# Patient Record
Sex: Female | Born: 1945 | Race: White | Hispanic: No | Marital: Married
Health system: Midwestern US, Academic
[De-identification: ages and names within clinical notes are randomized; demographics above are authoritative.]

## PROBLEM LIST (undated history)

## (undated) DIAGNOSIS — G20 Parkinson's disease: Secondary | ICD-10-CM

## (undated) DIAGNOSIS — Z9109 Other allergy status, other than to drugs and biological substances: Secondary | ICD-10-CM

## (undated) DIAGNOSIS — K219 Gastro-esophageal reflux disease without esophagitis: Secondary | ICD-10-CM

## (undated) DIAGNOSIS — F419 Anxiety disorder, unspecified: Secondary | ICD-10-CM

## (undated) DIAGNOSIS — G20A1 Parkinson's disease without dyskinesia, without mention of fluctuations: Secondary | ICD-10-CM

## (undated) HISTORY — DX: Parkinson's disease without dyskinesia, without mention of fluctuations: G20.A1

## (undated) HISTORY — DX: Gastro-esophageal reflux disease without esophagitis: K21.9

## (undated) HISTORY — DX: Other allergy status, other than to drugs and biological substances: Z91.09

## (undated) HISTORY — DX: Anxiety disorder, unspecified: F41.9

## (undated) HISTORY — PX: TUBAL LIGATION: SHX77

## (undated) HISTORY — DX: Parkinson's disease: G20

---

## 2014-12-11 ENCOUNTER — Telehealth: Payer: Self-pay

## 2014-12-11 ENCOUNTER — Ambulatory Visit (INDEPENDENT_AMBULATORY_CARE_PROVIDER_SITE_OTHER): Payer: Medicare Other | Admitting: Emergency Medicine

## 2014-12-11 ENCOUNTER — Ambulatory Visit (INDEPENDENT_AMBULATORY_CARE_PROVIDER_SITE_OTHER): Payer: Medicare Other

## 2014-12-11 VITALS — BP 122/80 | HR 66 | Temp 98.4°F | Ht 66.5 in | Wt 134.6 lb

## 2014-12-11 DIAGNOSIS — M79641 Pain in right hand: Secondary | ICD-10-CM

## 2014-12-11 DIAGNOSIS — S62609A Fracture of unspecified phalanx of unspecified finger, initial encounter for closed fracture: Secondary | ICD-10-CM

## 2014-12-11 DIAGNOSIS — J209 Acute bronchitis, unspecified: Secondary | ICD-10-CM | POA: Diagnosis not present

## 2014-12-11 MED ORDER — CLARITHROMYCIN 500 MG PO TABS: 500.0000 mg | ORAL_TABLET | Freq: Two times a day (BID) | ORAL | Status: AC

## 2014-12-11 MED ORDER — HYDROCODONE-ACETAMINOPHEN 5-325 MG PO TABS: 1.0000 | ORAL_TABLET | ORAL | Status: AC | PRN

## 2014-12-11 NOTE — Patient Instructions (Signed)
Hand Fracture, Metacarpals °Fractures of metacarpals are breaks in the bones of the hand. They extend from the knuckles to the wrist. These bones can undergo many types of fractures. There are different ways of treating these fractures, all of which may be correct. °TREATMENT  °Hand fractures can be treated with:  °· Non-reduction - The fracture is casted without changing the positions of the fracture (bone pieces) involved. This fracture is usually left in a cast for 4 to 6 weeks or as your caregiver thinks necessary. °· Closed reduction - The bones are moved back into position without surgery and then casted. °· ORIF (open reduction and internal fixation) - The fracture site is opened and the bone pieces are fixed into place with some type of hardware, such as screws, etc. They are then casted. °Your caregiver will discuss the type of fracture you have and the treatment that should be best for that problem. If surgery is chosen, let your caregivers know about the following.  °LET YOUR CAREGIVERS KNOW ABOUT: °· Allergies. °· Medications you are taking, including herbs, eye drops, over the counter medications, and creams. °· Use of steroids (by mouth or creams). °· Previous problems with anesthetics or novocaine. °· Possibility of pregnancy. °· History of blood clots (thrombophlebitis). °· History of bleeding or blood problems. °· Previous surgeries. °· Other health problems. °AFTER THE PROCEDURE °After surgery, you will be taken to the recovery area where a nurse will watch and check your progress. Once you are awake, stable, and taking fluids well, barring other problems, you'll be allowed to go home. Once home, an ice pack applied to your operative site may help with pain and keep the swelling down. °HOME CARE INSTRUCTIONS  °· Follow your caregiver's instructions as to activities, exercises, physical therapy, and driving a car. °· Daily exercise is helpful for keeping range of motion and strength. Exercise as  instructed. °· To lessen swelling, keep the injured hand elevated above the level of your heart as much as possible. °· Apply ice to the injury for 15-20 minutes each hour while awake for the first 2 days. Put the ice in a plastic bag and place a thin towel between the bag of ice and your cast. °· Move the fingers of your casted hand several times a day. °· If a plaster or fiberglass cast was applied: °¨ Do not try to scratch the skin under the cast using a sharp or pointed object. °¨ Check the skin around the cast every day. You may put lotion on red or sore areas. °¨ Keep your cast dry. Your cast can be protected during bathing with a plastic bag. Do not put your cast into the water. °· If a plaster splint was applied: °¨ Wear your splint for as long as directed by your caregiver or until seen again. °¨ Do not get your splint wet. Protect it during bathing with a plastic bag. °¨ You may loosen the elastic bandage around the splint if your fingers start to get numb, tingle, get cold or turn blue. °· Do not put pressure on your cast or splint; this may cause it to break. Especially, do not lean plaster casts on hard surfaces for 24 hours after application. °· Take medications as directed by your caregiver. °· Only take over-the-counter or prescription medicines for pain, discomfort, or fever as directed by your caregiver. °· Follow-up as provided by your caregiver. This is very important in order to avoid permanent injury or disability and chronic   pain. °SEEK MEDICAL CARE IF:  °· Increased bleeding (more than a small spot) from beneath your cast or splint if there is beneath the cast as with an open reduction. °· Redness, swelling, or increasing pain in the wound or from beneath your cast or splint. °· Pus coming from wound or from beneath your cast or splint. °· An unexplained oral temperature above 102° F (38.9° C) develops, or as your caregiver suggests. °· A foul smell coming from the wound or dressing or from  beneath your cast or splint. °· You have a problem moving any of your fingers. °SEEK IMMEDIATE MEDICAL CARE IF:  °· You develop a rash °· You have difficulty breathing °· You have any allergy problems °If you do not have a window in your cast for observing the wound, a discharge or minor bleeding may show up as a stain on the outside of your cast. Report these findings to your caregiver. °MAKE SURE YOU:  °· Understand these instructions. °· Will watch your condition. °· Will get help right away if you are not doing well or get worse. °Document Released: 09/10/2005 Document Revised: 12/03/2011 Document Reviewed: 04/29/2008 °ExitCare® Patient Information ©2015 [employer]. This information is not intended to replace advice given to you by your health care provider. Make sure you discuss any questions you have with your health care provider. ° °

## 2014-12-11 NOTE — Progress Notes (Signed)
[facility] [address], [place] [id number] [phone]  Date:  12/11/2014   Name:  Mackenzie PortLinda Fitzgerald   DOB:  [birth year]   MRN:  [id number]  PCP:  No PCP Per Patient    Chief Complaint: Cough and Hand Pain   History of Present Illness:  Mackenzie Fitzgerald is a 69 y.o. very pleasant female patient who presents with the following:  Ill for a week with a cough productive scant sputum Worse at night No wheezing or shortness of breath Nasal congestion and post nasal drip. Sore throat and pain in ears. Had a fever now resolved. FOOSH right hand on Monday.  Still painful and swollen No improvement with over the counter medications or other home remedies.  Denies other complaint or health concern today.   There are no active problems to display for this patient.   Past Medical History  Diagnosis Date  . Anxiety   . Environmental allergies   . GERD (gastroesophageal reflux disease)   . Parkinson disease     Past Surgical History  Procedure Laterality Date  . Tubal ligation      History  Substance Use Topics  . Smoking status: Never Smoker   . Smokeless tobacco: Not on file  . Alcohol Use: Not on file    Family History  Problem Relation Age of Onset  . Diabetes Mother   . Heart failure Father   . Cancer Brother   . Cancer Maternal Grandmother   . Cancer Maternal Grandfather   . Diabetes Paternal Grandmother     Allergies  Allergen Reactions  . Latex     Medication list has been reviewed and updated.  No current outpatient prescriptions on file prior to visit.   No current facility-administered medications on file prior to visit.    Review of Systems:  As per HPI, otherwise negative.    Physical Examination: Filed Vitals:   12/11/14 1034  BP: 122/80  Pulse: 66  Temp: 98.4 F (36.9 C)   Filed Vitals:   12/11/14 1034  Height: 5' 6.5" (1.689 m)  Weight: 134 lb 9.6 oz (61.054 kg)   Body mass index is 21.4 kg/(m^2). Ideal  Body Weight: Weight in (lb) to have BMI = 25: 156.9  GEN: WDWN, NAD, Non-toxic, A & O x 3 HEENT: Atraumatic, Normocephalic. Neck supple. No masses, No LAD. Ears and Nose: No external deformity. CV: RRR, No M/G/R. No JVD. No thrill. No extra heart sounds. PULM: CTA B, no wheezes, crackles, rhonchi. No retractions. No resp. distress. No accessory muscle use. ABD: S, NT, ND, +BS. No rebound. No HSM. EXTR: No c/c/e  Right hand swollen, tender and ecchymotic NEURO Normal gait.  PSYCH: Normally interactive. Conversant. Not depressed or anxious appearing.  Calm demeanor.    Assessment and Plan: Fracture second metacarpal Bronchitis Radial gutter Ortho biaxin tussionex   Signed,  Phillips OdorJeffery Anderson, MD   [facility] reading (PRIMARY) by  Dr. Dareen PianoAnderson.  Fracture head second MT.

## 2014-12-11 NOTE — Telephone Encounter (Signed)
Patient called in stating she had give the wrong cell number for someone to call, her number is [phone].

## 2015-10-11 IMAGING — CR DG HAND COMPLETE 3+V*R*
2 series · 2 of 2 positions shown · non-contrast
Comparison: None.

CLINICAL DATA: Initial evaluation for hand pain in the region of
the first metacarpal area with swelling and bruising, fell 5 days
ago

EXAM:
RIGHT HAND - COMPLETE 3+ VIEW

[PA]
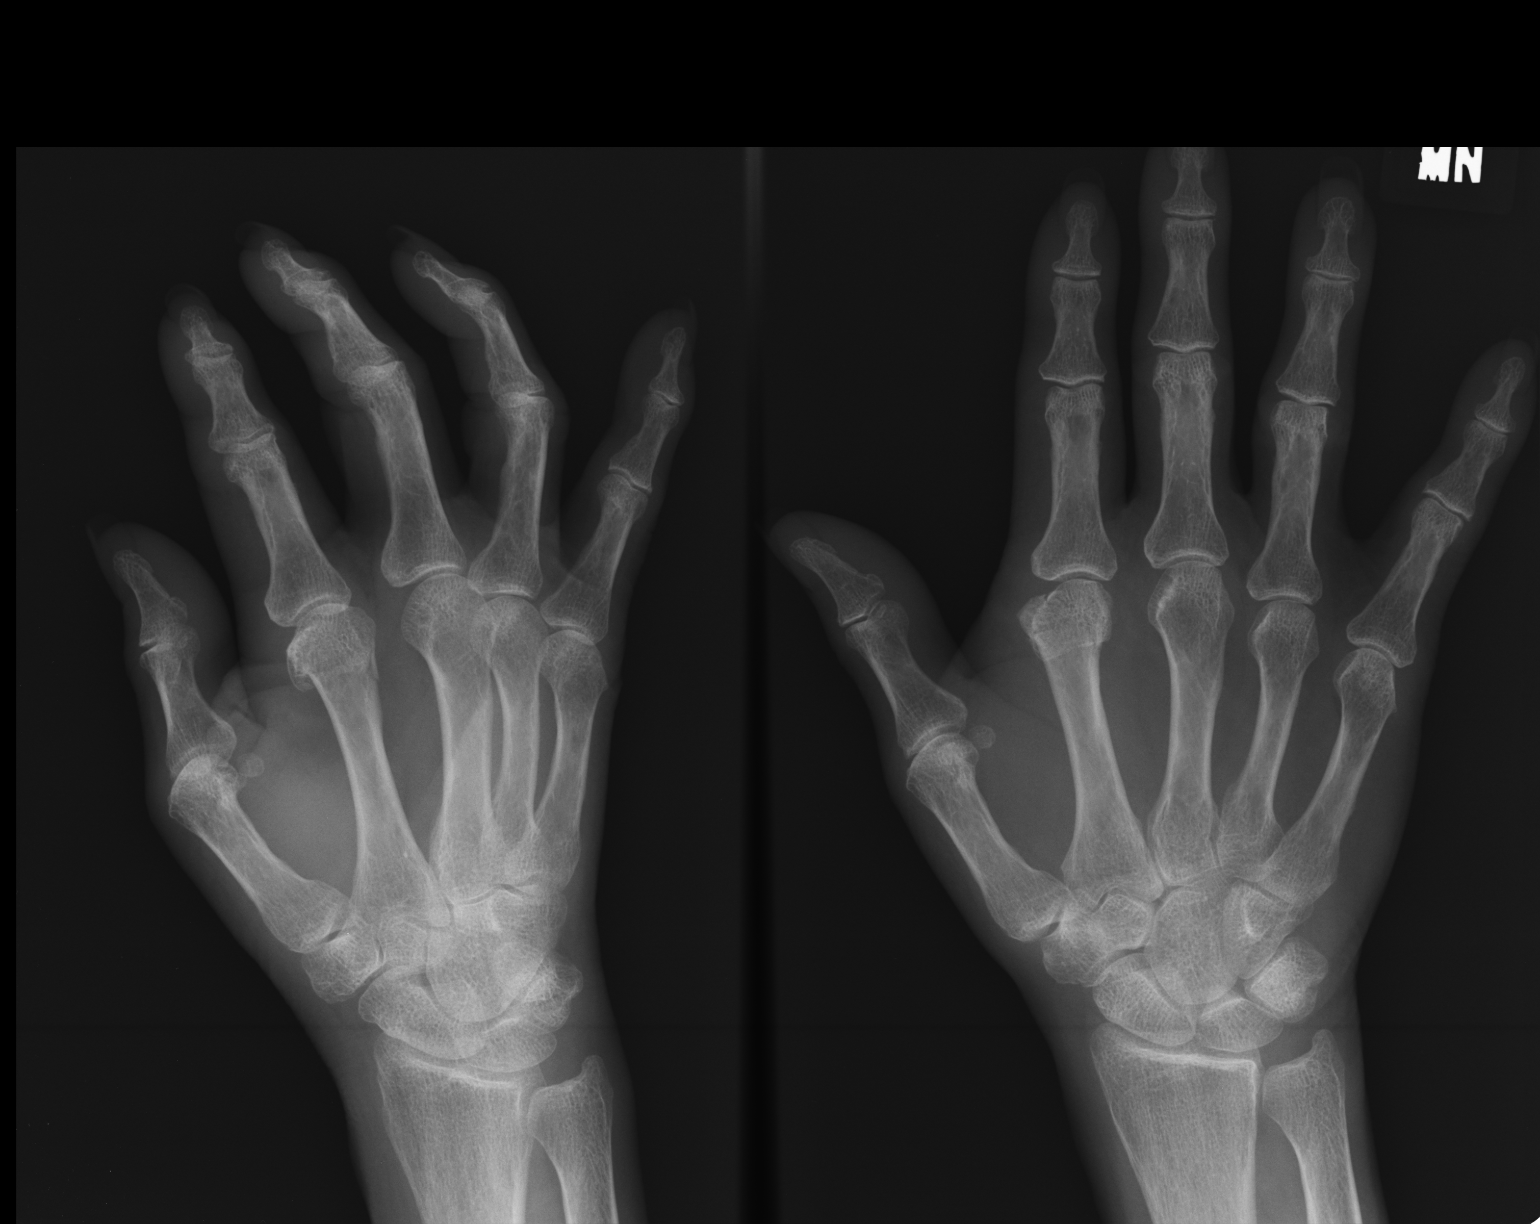

[lateral]
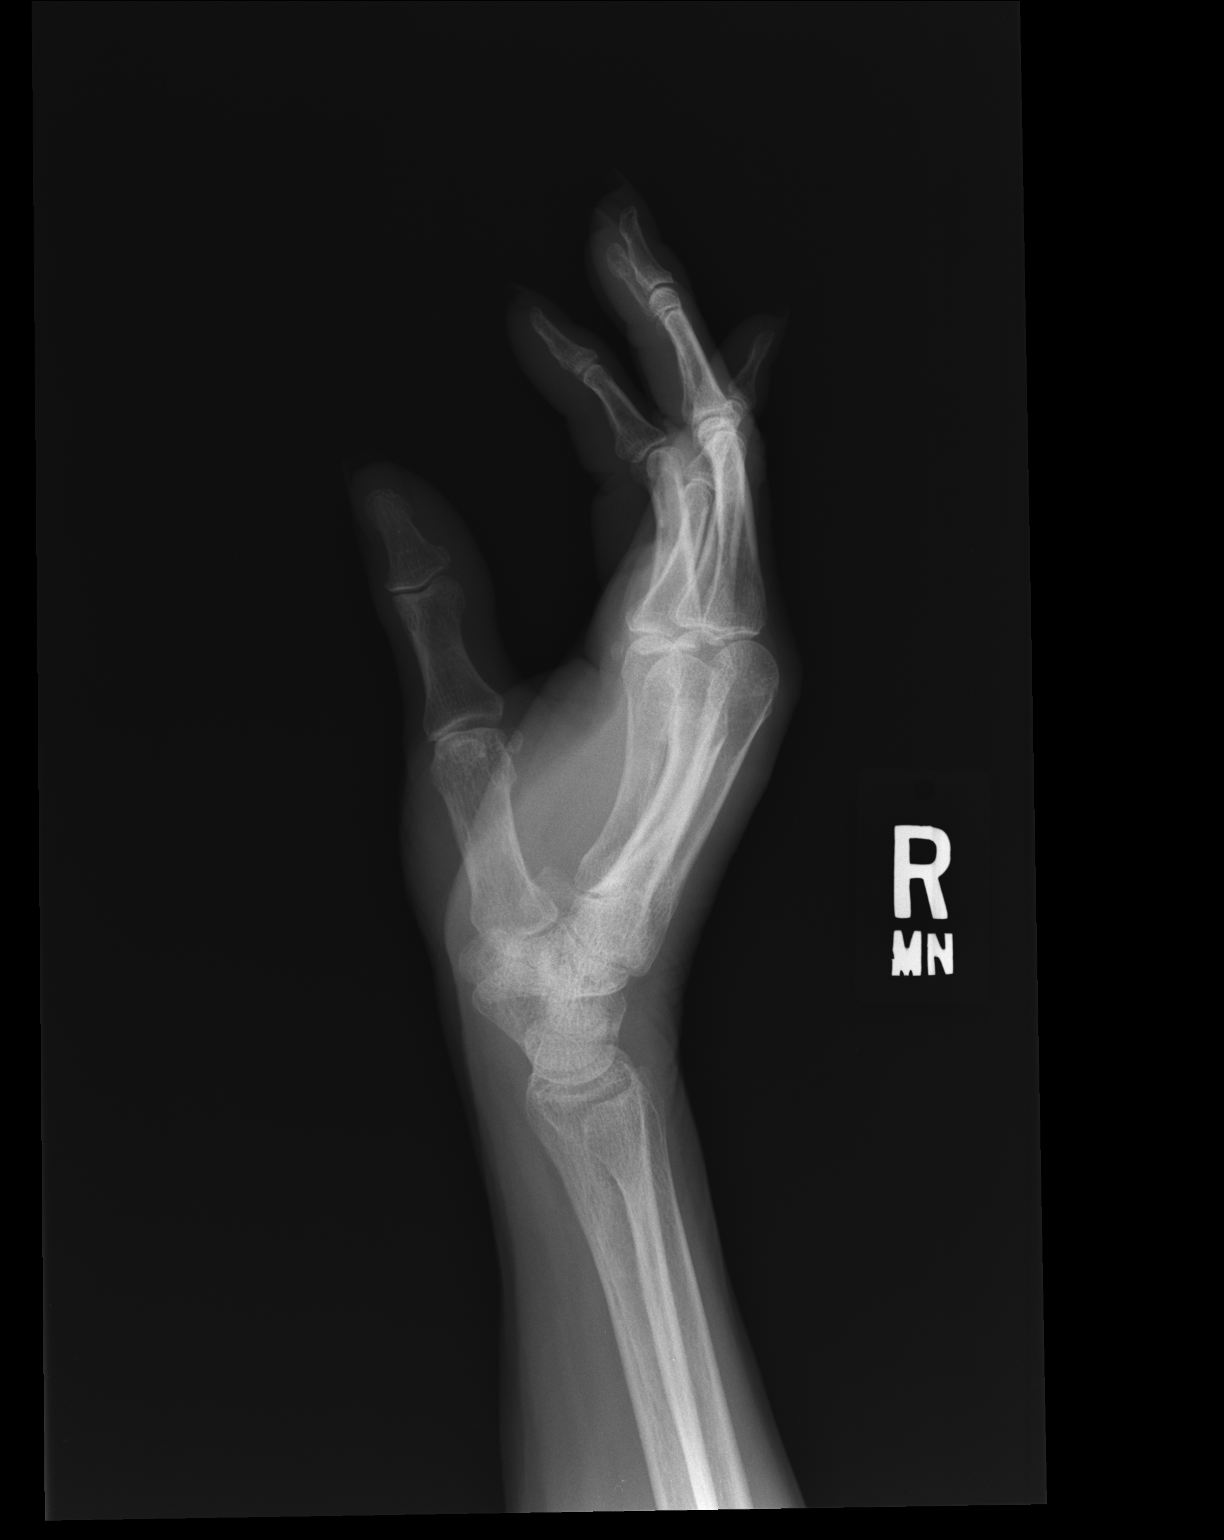

[2 of 2 positions shown; findings below may reference images not displayed]

FINDINGS: There is a fracture of the head of the second metacarpal. Fracture
line extends into the metacarpophalangeal joint and is mildly
displaced. There is mild apex dorsal angulation.
IMPRESSION: Fracture of the distal aspect of the second metacarpal.

## 2017-07-29 ENCOUNTER — Encounter: Admit: 2017-07-29 | Discharge: 2017-07-29 | Payer: MEDICARE

## 2017-09-10 ENCOUNTER — Encounter: Admit: 2017-09-10 | Discharge: 2017-09-10 | Payer: MEDICARE

## 2017-09-10 ENCOUNTER — Ambulatory Visit: Admit: 2017-09-10 | Discharge: 2017-09-10 | Payer: MEDICARE

## 2017-09-10 DIAGNOSIS — F3289 Other specified depressive episodes: ICD-10-CM

## 2017-09-10 DIAGNOSIS — M4800 Spinal stenosis, site unspecified: ICD-10-CM

## 2017-09-10 DIAGNOSIS — T50905A Adverse effect of unspecified drugs, medicaments and biological substances, initial encounter: ICD-10-CM

## 2017-09-10 DIAGNOSIS — G20 Parkinson's disease: Principal | ICD-10-CM

## 2017-09-10 DIAGNOSIS — R2689 Other abnormalities of gait and mobility: ICD-10-CM

## 2017-09-10 DIAGNOSIS — S72009A Fracture of unspecified part of neck of unspecified femur, initial encounter for closed fracture: ICD-10-CM

## 2017-09-10 DIAGNOSIS — R002 Palpitations: ICD-10-CM

## 2017-09-10 DIAGNOSIS — I10 Essential (primary) hypertension: ICD-10-CM

## 2017-09-10 DIAGNOSIS — L84 Corns and callosities: ICD-10-CM

## 2017-09-10 MED ORDER — PRAMIPEXOLE 0.25 MG PO TAB
ORAL_TABLET | Freq: Three times a day (TID) | ORAL | 3 refills | 45.00000 days | Status: AC
Start: 2017-09-10 — End: 2018-01-13

## 2017-09-10 NOTE — Progress Notes
Alexis Matthews is a 71 y.o. female.    Subjective:             History of Present Illness     Parkinson's Disease Follow-Up Visit    Since my last visit I am: Moderately worse    Symptoms Scale:    Memory problems: Slight, not bothersome  Hallucinations/delusions: Mild, illusions  Depression: None  Anxiety: Slight, not bothersome  Apathy: Slight, some loss of interest  Impulsive behavior: None  Nighttime sleep: No difficulty  Daytime sleepiness: Slight, occasionally  Vivid dreams: Slight, occasional  REM sleep behavior disorder: Slight, rarely  Restless leg syndrome: None  Pain or muscle cramps: None  Urination: Slight, go frequently, not bothersome  Constipation: Slight, occasionally  Dizziness or lightheadedness: Slight, occasional on standing  Tiredness/Fatigue: Slight, reduced stamina  Falling: Moderate, multiple times a month  Personal care assistance: Slight, I take care of myself, but several activities take me longer to complete    Total Score:  Total Score: 16    Assistive Devices:  Assistive devices for getting around: Walker    OFF Time:    OFF time: No    Dyskinesia:    Dyskinesia while awake: No    Employment Status:    Employment: Retired - not due to PD          Review of Systems   Gastrointestinal: Positive for constipation. Negative for abdominal distention, abdominal pain, anal bleeding, blood in stool, diarrhea, nausea, rectal pain and vomiting.   Genitourinary: Positive for enuresis.   Neurological: Positive for speech difficulty and light-headedness.         Objective:         ??? amantadine (SYMMETREL) 100 mg capsule 1 Cap daily.   ??? esomeprazole DR(+) (NEXIUM) 20 mg capsule Take 20 mg by mouth twice daily. Take on an empty stomach at least 1 hour before or 2 hours after food.   ??? gabapentin (NEURONTIN) 100 mg capsule Take 100 mg by mouth every 8 hours. Take 1.0 tablet daily.   ??? levothyroxine (SYNTHROID) 100 mcg tablet Take 100 mcg by mouth daily. ??? LORazepam (ATIVAN) 0.5 mg tablet Take 0.25 mg by mouth every 8 hours as needed. Tale 0.5 tablet once a day at 9pm.   ??? ondansetron (ZOFRAN) 4 mg tablet Take 4 mg by mouth every 8 hours as needed for Nausea.   ??? oxybutynin XL (DITROPAN XL) 10 mg tablet Take 10 mg by mouth daily.   ??? PARoxetine (PAXIL) 20 mg tablet Take 1 Tab by mouth daily.   ??? pramipexole (MIRAPEX) 0.25 mg tablet Start 1/2 tab three times a day for one week, one tablet three times a day for one week and two tablets three times a day thereafter.   ??? ranitidine(+) (ZANTAC) 300 mg tablet Take 1 Tab by mouth at bedtime daily.     Vitals:    09/10/17 1353 09/10/17 1358   BP: 135/76 107/63   Pulse: 69 83   Weight: 67 kg (147 lb 11.3 oz)    Height: 167.6 cm (66)      Body mass index is 23.84 kg/m???.     Physical Exam    EXAMINATION:     ORIENTATION: Alert and Oriented.  Visuospatial/Executive Points: 3/5  Naming Points: 3/3  Attention Points: 5/6  Language Points: 3/3  Abstraction Points: 2/2  Delayed Recall Points: 3/5  Orientation Points: 6/6  Education <or= 12 Years: 0/1  MOCA Score (out of 30): 25  Cranial Nerves: II-XII Unremarkable except reduced facial expression and soft voice.        Right Left   Bradkinesia  Mild Mild   Tremor Hands Resting None None    Postural None None    Kinetic None None           Tremor Other: None  Dyskinesia: None  Muscle Strength: Unremarkable  Gait: Needs assistance to walk    Unified Parkinson's Disease Rating Scale: OFF  Total Mentation Score: 4  Total Activities of Daily Living Score: 15  Total Motor Exam: 29  Total UPDRS Score: 48    Hoehn & Yahr Staging: 4    Schwab & [place] Staging: 75 %    Geriatric Depression Scale: 16 suggesting moderate depression.    Epworth Sleepiness Scale: 6 suggesting daytime drowsiness.    PDQ-39 suggests that his symptoms impact his quality of life by:  PDQ 39 IMPACT  Mobility Percent: 55 %  ADL Percent: 20.83 %  Emotional Percent: 29.17 %  Social Stigma Percent: 18.75 % Social Support Percent: 25 %  Cognition Percent: 25 %  Communication Percent: 33.33 %  Body Discomfort Percent: 58.33 %  PDQ Total Percent: 35.26 %                         Assessment and Plan:    Problem   Parkinson's disease with Bilateral STN Implants    Symptoms began in 2001, initial symptoms was right hand tremor. Diagnosed with Parkinson's disease in 2001. Atypical PD Features: None  Patient has difficulty tolerating Sinemet (carbidopa/levodopa) and dopamine agonists.   09/29/08 UPDRS Motor OFF 36  Underwent bilateral Subthalamic stimulator implants in 2010 for bothersome tremor with marked improvement.   03/24/2012 Mentation Score: 2 Activities of Daily Living Score: 4 Motor Exam:19 Total UPDRS Score: 25    PDQ 39 IMPACT PDQ Total Percent: 6.41 %  04/30/2013  Mentation Score: 3 Activities of Daily Living Score: 13 Motor Exam: 28 Total UPDRS Score: 44   PDQ 39 IMPACT PDQ Total Percent: 5.77 %   05/06/2014 Total Mentation Score: 5 Total Activities of Daily Living Score: 7 Total Motor Exam: 18 Total UPDRS Score: 30  PDQ Total Percent: 17.95 %  12/06/2014 PDQ8 Total %: 25   RIGHT BRAIN/ LEFT BODY LEFT BRAIN/ RIGHT BODY   Amplitude: 2.8 volts   Pulse Width: 60 micro secs  Rate: 160 pps   Load Impedance: 821 ohms   Leads: 0 Off,1 -,2 Off,3 Off, Case +  Battery: 2.97 V Amplitude: 2.7 volts   Pulse Width: 60 micro secs  Rate: 145 pps   Load Impedance: 989 ohms   Leads: 0 Off,1 -,2 Off,3 Off, Case +  Battery: 2.98 V   02/08/2015 PDQ8 Total %: 16   Patient was evaluated by Occupational Therapist, Kelli Reiling.    05/27/2015 Total Mentation Score: 4 Total Activities of Daily Living Score: 16 Total Motor Exam: 34 Total UPDRS Score: 54   PDQ Total Percent: 33.33 %   Stimulator Settings:  RIGHT BRAIN/ LEFT BODY LEFT BRAIN/ RIGHT BODY   Amplitude: 2.8 volts   Pulse Width: 60 micro secs  Rate: 135 pps   Load Impedance: 805 ohms   Leads: 0 Off,1 -,2 Off,3 Off, Case +  Battery: 2.94 V Amplitude: 2.7 volts   Pulse Width: 60 micro secs Rate: 135 pps   Load Impedance: 1021 ohms   Leads: 0 Off,1 -,2 Off,3 Off, Case +  Battery: 2.97 V  Patient was evaluated by Occupational Therapist, Harvin Hazel Reiling.    12/07/2015 PDQ8 Total %: 12   07/17/2016 Total Mentation Score: 4 Total Activities of Daily Living Score: 13 Total Motor Exam: 33 Total UPDRS Score: 50   PDQ Total Percent: 33.33 %   02/12/2017 PDQ8 Total %: 19   Nausea and fogginess with Sinemet (carbidopa/levodopa) 25/100   09/10/2017 PDQ Total Percent: 35.26 %   Total Mentation Score: 4 Total Activities of Daily Living Score: 15 Total Motor Exam: 29 Total UPDRS Score: 48      Depression    05/06/2014 Geriatric Depression Scale: 13  12/06/2014 On Paxil (paroxetine)   05/27/2015 Wellbutrin (bupropion) caused nausea  05/27/2015 Geriatric Depression Scale: 12 Does not like Paxil (paroxetine)   07/17/2016 Geriatric Depression Scale: 9   09/10/2017 Geriatric Depression Scale: 16          Parkinson's disease with Bilateral STN Implants  Patient was started on Mirapex for Parkinson's Disease. She was started on Mirapex 0.25 mg one half tablet three times a day and she was given instructions on increasing it to 0.5 mg three times a day. Side effects of Mirapex was discussed with the patient and she was given a list of common side effects. The patient will call us in 3-4 weeks to update [place] on their Parkinson's Disease status when further medication adjustments if necessary will be made. Patient was referred to the programming nurse to have IPG checked and reprogrammed.     Depression  I recommended evaluation and treatment by our psychologist, Dr Lorna Dibble.      Patient will follow up in approximately 6 months.

## 2017-09-10 NOTE — Assessment & Plan Note
I recommended evaluation and treatment by our psychologist, Dr Sewell.

## 2017-09-10 NOTE — Assessment & Plan Note
Patient was started on Mirapex for Parkinson's Disease. She was started on Mirapex 0.25 mg one half tablet three times a day and she was given instructions on increasing it to 0.5 mg three times a day. Side effects of Mirapex was discussed with the patient and she was given a list of common side effects. The patient will call us in 3-4 weeks to update [place] on their Parkinson's Disease status when further medication adjustments if necessary will be made. Patient was referred to the programming nurse to have IPG checked and reprogrammed.

## 2017-09-11 NOTE — Patient Education
Patient Caregiver Education:      Department Contacts/phone numbers: Yes   Safety for diathermy: No   Patient resources: No   Diagnostics: Yes   Medical alert identification: No   Exercise: Yes   Hardware maintenance/problems: Yes   Dental care: Yes   Diet: Yes    Patient programmer education:     On/off status: Yes   Self Adjustments: Yes   Battery status: Yes       Educational Goals/Outcomes; Patient/Family will verbalize understanding to the following:           Yes/No          Comments   Disease Process     Yes    Diagnostic Procedures     Yes      Treatment Plan     Yes    Medications     Yes    Nutrition/Diet     Yes    Personal Hygiene     Yes    Side Effect Management     Yes    Medical Equipment     Yes    Safety     Yes    Discharge Instructions     Yes    Other

## 2017-10-16 ENCOUNTER — Encounter: Admit: 2017-10-16 | Discharge: 2017-10-16 | Payer: MEDICARE

## 2017-11-07 ENCOUNTER — Ambulatory Visit: Admit: 2017-11-07 | Discharge: 2017-11-08 | Payer: MEDICARE

## 2017-11-07 DIAGNOSIS — G20 Parkinson's disease: Principal | ICD-10-CM

## 2017-11-08 DIAGNOSIS — F064 Anxiety disorder due to known physiological condition: ICD-10-CM

## 2017-11-08 DIAGNOSIS — R471 Dysarthria and anarthria: ICD-10-CM

## 2017-11-08 DIAGNOSIS — F0631 Mood disorder due to known physiological condition with depressive features: ICD-10-CM

## 2017-12-04 ENCOUNTER — Encounter: Admit: 2017-12-04 | Discharge: 2017-12-04 | Payer: MEDICARE

## 2017-12-04 DIAGNOSIS — F419 Anxiety disorder, unspecified: ICD-10-CM

## 2017-12-04 DIAGNOSIS — G20 Parkinson's disease: Principal | ICD-10-CM

## 2018-01-13 ENCOUNTER — Encounter: Admit: 2018-01-13 | Discharge: 2018-01-13 | Payer: MEDICARE

## 2018-01-13 ENCOUNTER — Ambulatory Visit: Admit: 2018-01-13 | Discharge: 2018-01-13 | Payer: MEDICARE

## 2018-01-13 DIAGNOSIS — I10 Essential (primary) hypertension: ICD-10-CM

## 2018-01-13 DIAGNOSIS — R1312 Dysphagia, oropharyngeal phase: ICD-10-CM

## 2018-01-13 DIAGNOSIS — L84 Corns and callosities: ICD-10-CM

## 2018-01-13 DIAGNOSIS — G20 Parkinson's disease: Principal | ICD-10-CM

## 2018-01-13 DIAGNOSIS — T50905A Adverse effect of unspecified drugs, medicaments and biological substances, initial encounter: ICD-10-CM

## 2018-01-13 DIAGNOSIS — Z9181 History of falling: ICD-10-CM

## 2018-01-13 DIAGNOSIS — M4800 Spinal stenosis, site unspecified: ICD-10-CM

## 2018-01-13 DIAGNOSIS — R2689 Other abnormalities of gait and mobility: ICD-10-CM

## 2018-01-13 DIAGNOSIS — F3289 Other specified depressive episodes: ICD-10-CM

## 2018-01-13 DIAGNOSIS — R002 Palpitations: ICD-10-CM

## 2018-01-13 DIAGNOSIS — S72009A Fracture of unspecified part of neck of unspecified femur, initial encounter for closed fracture: ICD-10-CM

## 2018-01-13 MED ORDER — CARBIDOPA-LEVODOPA 23.75-95 MG PO CPER
1 | ORAL_CAPSULE | Freq: Three times a day (TID) | ORAL | 3 refills | Status: AC
Start: 2018-01-13 — End: 2018-02-10

## 2018-01-27 ENCOUNTER — Encounter: Admit: 2018-01-27 | Discharge: 2018-01-27 | Payer: MEDICARE

## 2018-01-28 ENCOUNTER — Encounter: Admit: 2018-01-28 | Discharge: 2018-01-28 | Payer: MEDICARE

## 2018-02-07 ENCOUNTER — Encounter: Admit: 2018-02-07 | Discharge: 2018-02-07 | Payer: MEDICARE

## 2018-03-19 ENCOUNTER — Ambulatory Visit: Admit: 2018-03-19 | Discharge: 2018-03-20 | Payer: MEDICARE

## 2018-03-20 DIAGNOSIS — G20 Parkinson's disease: ICD-10-CM

## 2018-03-20 DIAGNOSIS — R471 Dysarthria and anarthria: Principal | ICD-10-CM

## 2018-03-20 DIAGNOSIS — R1312 Dysphagia, oropharyngeal phase: ICD-10-CM

## 2018-03-20 DIAGNOSIS — R498 Other voice and resonance disorders: ICD-10-CM

## 2018-06-09 ENCOUNTER — Encounter: Admit: 2018-06-09 | Discharge: 2018-06-09 | Payer: MEDICARE

## 2018-06-11 ENCOUNTER — Ambulatory Visit: Admit: 2018-06-11 | Discharge: 2018-06-11 | Payer: MEDICARE

## 2018-06-11 ENCOUNTER — Encounter: Admit: 2018-06-11 | Discharge: 2018-06-11 | Payer: MEDICARE

## 2018-06-11 DIAGNOSIS — G20 Parkinson's disease: Principal | ICD-10-CM

## 2018-06-11 DIAGNOSIS — R002 Palpitations: ICD-10-CM

## 2018-06-11 DIAGNOSIS — R2689 Other abnormalities of gait and mobility: ICD-10-CM

## 2018-06-11 DIAGNOSIS — G44309 Post-traumatic headache, unspecified, not intractable: Secondary | ICD-10-CM

## 2018-06-11 DIAGNOSIS — M4800 Spinal stenosis, site unspecified: ICD-10-CM

## 2018-06-11 DIAGNOSIS — Z9181 History of falling: ICD-10-CM

## 2018-06-11 DIAGNOSIS — S72009A Fracture of unspecified part of neck of unspecified femur, initial encounter for closed fracture: ICD-10-CM

## 2018-06-11 DIAGNOSIS — I10 Essential (primary) hypertension: ICD-10-CM

## 2018-06-11 DIAGNOSIS — T50905A Adverse effect of unspecified drugs, medicaments and biological substances, initial encounter: ICD-10-CM

## 2018-06-11 DIAGNOSIS — F3289 Other specified depressive episodes: ICD-10-CM

## 2018-06-11 DIAGNOSIS — L84 Corns and callosities: ICD-10-CM

## 2018-06-27 ENCOUNTER — Encounter: Admit: 2018-06-27 | Discharge: 2018-06-27 | Payer: MEDICARE

## 2018-06-27 DIAGNOSIS — G20 Parkinson's disease: Principal | ICD-10-CM

## 2018-07-01 DIAGNOSIS — G20 Parkinson's disease: Principal | ICD-10-CM

## 2018-07-04 ENCOUNTER — Encounter: Admit: 2018-07-04 | Discharge: 2018-07-04 | Payer: MEDICARE

## 2018-07-04 DIAGNOSIS — G20 Parkinson's disease: Principal | ICD-10-CM

## 2018-07-04 MED ORDER — CARBIDOPA-LEVODOPA 23.75-95 MG PO CPER
1 | ORAL_CAPSULE | Freq: Every day | ORAL | 3 refills | Status: AC
Start: 2018-07-04 — End: 2019-03-03

## 2018-07-08 ENCOUNTER — Encounter: Admit: 2018-07-08 | Discharge: 2018-07-08 | Payer: MEDICARE

## 2018-07-09 ENCOUNTER — Ambulatory Visit: Admit: 2018-07-09 | Discharge: 2018-07-10 | Payer: MEDICARE

## 2018-07-09 ENCOUNTER — Ambulatory Visit: Admit: 2018-07-09 | Discharge: 2018-07-09 | Payer: MEDICARE

## 2018-07-09 ENCOUNTER — Encounter: Admit: 2018-07-09 | Discharge: 2018-07-09 | Payer: MEDICARE

## 2018-07-09 DIAGNOSIS — S72009A Fracture of unspecified part of neck of unspecified femur, initial encounter for closed fracture: ICD-10-CM

## 2018-07-09 DIAGNOSIS — M4800 Spinal stenosis, site unspecified: ICD-10-CM

## 2018-07-09 DIAGNOSIS — R011 Cardiac murmur, unspecified: ICD-10-CM

## 2018-07-09 DIAGNOSIS — R002 Palpitations: ICD-10-CM

## 2018-07-09 DIAGNOSIS — R2689 Other abnormalities of gait and mobility: ICD-10-CM

## 2018-07-09 DIAGNOSIS — L84 Corns and callosities: ICD-10-CM

## 2018-07-09 DIAGNOSIS — E049 Nontoxic goiter, unspecified: ICD-10-CM

## 2018-07-09 DIAGNOSIS — T50905A Adverse effect of unspecified drugs, medicaments and biological substances, initial encounter: ICD-10-CM

## 2018-07-09 DIAGNOSIS — Z974 Presence of external hearing-aid: ICD-10-CM

## 2018-07-09 DIAGNOSIS — K219 Gastro-esophageal reflux disease without esophagitis: ICD-10-CM

## 2018-07-09 DIAGNOSIS — G20 Parkinson's disease: Principal | ICD-10-CM

## 2018-07-09 DIAGNOSIS — I10 Essential (primary) hypertension: ICD-10-CM

## 2018-07-09 DIAGNOSIS — F419 Anxiety disorder, unspecified: ICD-10-CM

## 2018-07-09 LAB — BASIC METABOLIC PANEL
Lab: 106 MMOL/L (ref 98–110)
Lab: 139 MMOL/L — ABNORMAL LOW (ref >60)
Lab: 23 MMOL/L — ABNORMAL LOW (ref 21–30)
Lab: 3.6 MMOL/L — ABNORMAL LOW (ref 3.5–5.1)
Lab: 56 mL/min — ABNORMAL LOW (ref >60)
Lab: >60 mL/min (ref >60)

## 2018-07-09 LAB — CBC: Lab: 6.2 10*3/uL (ref >60)

## 2018-07-10 ENCOUNTER — Encounter: Admit: 2018-07-10 | Discharge: 2018-07-10 | Payer: MEDICARE

## 2018-07-10 DIAGNOSIS — Z01818 Encounter for other preprocedural examination: Secondary | ICD-10-CM

## 2018-07-10 DIAGNOSIS — Z01810 Encounter for preprocedural cardiovascular examination: Principal | ICD-10-CM

## 2018-07-10 DIAGNOSIS — I10 Essential (primary) hypertension: ICD-10-CM

## 2018-07-16 ENCOUNTER — Encounter: Admit: 2018-07-16 | Discharge: 2018-07-16 | Payer: MEDICARE

## 2018-07-24 ENCOUNTER — Encounter: Admit: 2018-07-24 | Discharge: 2018-07-24 | Payer: MEDICARE

## 2018-08-26 ENCOUNTER — Encounter: Admit: 2018-08-26 | Discharge: 2018-08-26 | Payer: MEDICARE

## 2018-08-28 ENCOUNTER — Encounter: Admit: 2018-08-28 | Discharge: 2018-08-28 | Payer: MEDICARE

## 2018-08-29 ENCOUNTER — Encounter: Admit: 2018-08-29 | Discharge: 2018-08-29 | Payer: MEDICARE

## 2018-09-04 ENCOUNTER — Encounter: Admit: 2018-09-04 | Discharge: 2018-09-04 | Payer: MEDICARE

## 2018-09-12 ENCOUNTER — Encounter: Admit: 2018-09-12 | Discharge: 2018-09-12 | Payer: MEDICARE

## 2018-09-18 ENCOUNTER — Encounter: Admit: 2018-09-18 | Discharge: 2018-09-18 | Payer: MEDICARE

## 2018-10-03 ENCOUNTER — Ambulatory Visit: Admit: 2018-10-03 | Discharge: 2018-10-03 | Payer: MEDICARE

## 2018-10-03 ENCOUNTER — Encounter: Admit: 2018-10-03 | Discharge: 2018-10-03 | Payer: MEDICARE

## 2018-10-03 DIAGNOSIS — I10 Essential (primary) hypertension: Secondary | ICD-10-CM

## 2018-10-03 DIAGNOSIS — F419 Anxiety disorder, unspecified: Secondary | ICD-10-CM

## 2018-10-03 DIAGNOSIS — R011 Cardiac murmur, unspecified: Secondary | ICD-10-CM

## 2018-10-03 DIAGNOSIS — R2689 Other abnormalities of gait and mobility: Secondary | ICD-10-CM

## 2018-10-03 DIAGNOSIS — Z974 Presence of external hearing-aid: Secondary | ICD-10-CM

## 2018-10-03 DIAGNOSIS — E049 Nontoxic goiter, unspecified: Secondary | ICD-10-CM

## 2018-10-03 DIAGNOSIS — T50905A Adverse effect of unspecified drugs, medicaments and biological substances, initial encounter: Secondary | ICD-10-CM

## 2018-10-03 DIAGNOSIS — S72009A Fracture of unspecified part of neck of unspecified femur, initial encounter for closed fracture: Secondary | ICD-10-CM

## 2018-10-03 DIAGNOSIS — R002 Palpitations: Secondary | ICD-10-CM

## 2018-10-03 DIAGNOSIS — G20 Parkinson's disease: Secondary | ICD-10-CM

## 2018-10-03 DIAGNOSIS — M4800 Spinal stenosis, site unspecified: Secondary | ICD-10-CM

## 2018-10-03 DIAGNOSIS — L84 Corns and callosities: Secondary | ICD-10-CM

## 2018-10-03 DIAGNOSIS — K219 Gastro-esophageal reflux disease without esophagitis: Secondary | ICD-10-CM

## 2018-10-03 MED ORDER — BACITRACIN ZINC 500 UNIT/GRAM TP OINT
0 refills | Status: DC
Start: 2018-10-03 — End: 2018-10-03
  Administered 2018-10-03: 15:00:00 1 via TOPICAL

## 2018-10-03 MED ORDER — PROPOFOL 10 MG/ML IV EMUL (INFUSION)(AM)(OR)
0 refills | Status: DC
Start: 2018-10-03 — End: 2018-10-03
  Administered 2018-10-03: 14:00:00 100 ug/kg/min via INTRAVENOUS

## 2018-10-03 MED ORDER — BUPIVACAINE 0.5 % (5 MG/ML) IJ SOLN
0 refills | Status: DC
Start: 2018-10-03 — End: 2018-10-03
  Administered 2018-10-03: 16:00:00 10 mL via SUBCUTANEOUS

## 2018-10-03 MED ORDER — TRAMADOL 50 MG PO TAB
50 mg | ORAL_TABLET | ORAL | 0 refills | Status: AC | PRN
Start: 2018-10-03 — End: 2019-03-03
  Filled 2018-10-03 (×2): qty 8, 2d supply, fill #1

## 2018-10-03 MED ORDER — CEFAZOLIN 1 GRAM IJ SOLR
0 refills | Status: DC
Start: 2018-10-03 — End: 2018-10-03
  Administered 2018-10-03: 15:00:00 2 g via INTRAVENOUS

## 2018-10-03 MED ORDER — FENTANYL CITRATE (PF) 50 MCG/ML IJ SOLN
25 ug | INTRAVENOUS | 0 refills | Status: DC | PRN
Start: 2018-10-03 — End: 2018-10-03

## 2018-10-03 MED ORDER — OXYCODONE 5 MG/5 ML PO SOLN
5 mg | Freq: Once | ORAL | 0 refills | Status: CP | PRN
Start: 2018-10-03 — End: ?
  Administered 2018-10-03: 17:00:00 5 mg via ORAL

## 2018-10-03 MED ORDER — SODIUM CHLORIDE 0.9 % IV SOLP
1000 mL | INTRAVENOUS | 0 refills | Status: DC
Start: 2018-10-03 — End: 2018-10-03

## 2018-10-03 MED ORDER — LIDOCAINE HCL 10 MG/ML (1 %) IJ SOLN
0 refills | Status: DC
Start: 2018-10-03 — End: 2018-10-03
  Administered 2018-10-03: 16:00:00 10 mL via SUBCUTANEOUS

## 2018-10-03 MED ORDER — ACETAMINOPHEN 500 MG PO TAB
1000 mg | ORAL | 0 refills | Status: DC | PRN
Start: 2018-10-03 — End: 2018-10-03
  Administered 2018-10-03: 17:00:00 1000 mg via ORAL

## 2018-10-03 MED ORDER — LIDOCAINE (PF) 10 MG/ML (1 %) IJ SOLN
.1-2 mL | INTRAMUSCULAR | 0 refills | Status: DC | PRN
Start: 2018-10-03 — End: 2018-10-03

## 2018-10-03 MED ORDER — HYDROMORPHONE (PF) 2 MG/ML IJ SYRG
.2 mg | INTRAVENOUS | 0 refills | Status: DC | PRN
Start: 2018-10-03 — End: 2018-10-03

## 2018-10-03 MED ADMIN — SODIUM CHLORIDE 0.9 % IV SOLP [27838]: 1000 mL | INTRAVENOUS | @ 12:00:00 | Stop: 2018-10-03 | NDC 00338004904

## 2018-10-06 ENCOUNTER — Encounter: Admit: 2018-10-06 | Discharge: 2018-10-06 | Payer: MEDICARE

## 2018-10-06 DIAGNOSIS — T50905A Adverse effect of unspecified drugs, medicaments and biological substances, initial encounter: Secondary | ICD-10-CM

## 2018-10-06 DIAGNOSIS — E049 Nontoxic goiter, unspecified: Secondary | ICD-10-CM

## 2018-10-06 DIAGNOSIS — R2689 Other abnormalities of gait and mobility: Secondary | ICD-10-CM

## 2018-10-06 DIAGNOSIS — R011 Cardiac murmur, unspecified: Secondary | ICD-10-CM

## 2018-10-06 DIAGNOSIS — S72009A Fracture of unspecified part of neck of unspecified femur, initial encounter for closed fracture: Secondary | ICD-10-CM

## 2018-10-06 DIAGNOSIS — L84 Corns and callosities: Secondary | ICD-10-CM

## 2018-10-06 DIAGNOSIS — K219 Gastro-esophageal reflux disease without esophagitis: Secondary | ICD-10-CM

## 2018-10-06 DIAGNOSIS — I10 Essential (primary) hypertension: Secondary | ICD-10-CM

## 2018-10-06 DIAGNOSIS — Z974 Presence of external hearing-aid: Secondary | ICD-10-CM

## 2018-10-06 DIAGNOSIS — M4800 Spinal stenosis, site unspecified: Secondary | ICD-10-CM

## 2018-10-06 DIAGNOSIS — R002 Palpitations: Secondary | ICD-10-CM

## 2018-10-06 DIAGNOSIS — G20 Parkinson's disease: Secondary | ICD-10-CM

## 2018-10-06 DIAGNOSIS — F419 Anxiety disorder, unspecified: Secondary | ICD-10-CM

## 2018-11-20 ENCOUNTER — Encounter: Admit: 2018-11-20 | Discharge: 2018-11-20 | Payer: MEDICARE

## 2019-02-13 ENCOUNTER — Encounter: Admit: 2019-02-13 | Discharge: 2019-02-13 | Payer: MEDICARE

## 2019-02-13 DIAGNOSIS — R443 Hallucinations, unspecified: Principal | ICD-10-CM

## 2019-02-23 ENCOUNTER — Encounter: Admit: 2019-02-23 | Discharge: 2019-02-23

## 2019-02-23 NOTE — Telephone Encounter
Received call from patient who states she stopped amantadine about 2 weeks per your direction. Patient states when she stopped the medication is made her sick to her stomach and made her hallucinate. Patient states she was hesitant to DC medication. Husband states restarted amantadine last night and patient is doing better today with medication. Medication added back on to current medication list.     Husband states they have not completed blood work and UA as they did not know it needed done. Husband provided phone number for Dr. Gabriel Carina to have lab work sent to.    This RN called office at [phone] and obtained fax number of [phone]. Orders printed and faxed per husband request.

## 2019-03-03 ENCOUNTER — Ambulatory Visit: Admit: 2019-03-03 | Discharge: 2019-03-03

## 2019-03-03 ENCOUNTER — Encounter: Admit: 2019-03-03 | Discharge: 2019-03-03

## 2019-03-03 DIAGNOSIS — G20 Parkinson's disease: Secondary | ICD-10-CM

## 2019-03-03 DIAGNOSIS — Z9181 History of falling: Secondary | ICD-10-CM

## 2019-03-03 DIAGNOSIS — K219 Gastro-esophageal reflux disease without esophagitis: Secondary | ICD-10-CM

## 2019-03-03 DIAGNOSIS — E049 Nontoxic goiter, unspecified: Secondary | ICD-10-CM

## 2019-03-03 DIAGNOSIS — R2689 Other abnormalities of gait and mobility: Secondary | ICD-10-CM

## 2019-03-03 DIAGNOSIS — I10 Essential (primary) hypertension: Secondary | ICD-10-CM

## 2019-03-03 DIAGNOSIS — R002 Palpitations: Secondary | ICD-10-CM

## 2019-03-03 DIAGNOSIS — Z974 Presence of external hearing-aid: Secondary | ICD-10-CM

## 2019-03-03 DIAGNOSIS — F419 Anxiety disorder, unspecified: Secondary | ICD-10-CM

## 2019-03-03 DIAGNOSIS — S72009A Fracture of unspecified part of neck of unspecified femur, initial encounter for closed fracture: Secondary | ICD-10-CM

## 2019-03-03 DIAGNOSIS — L84 Corns and callosities: Secondary | ICD-10-CM

## 2019-03-03 DIAGNOSIS — R011 Cardiac murmur, unspecified: Secondary | ICD-10-CM

## 2019-03-03 DIAGNOSIS — F3289 Other specified depressive episodes: Secondary | ICD-10-CM

## 2019-03-03 DIAGNOSIS — M4800 Spinal stenosis, site unspecified: Secondary | ICD-10-CM

## 2019-03-03 DIAGNOSIS — T50905A Adverse effect of unspecified drugs, medicaments and biological substances, initial encounter: Secondary | ICD-10-CM

## 2019-03-03 DIAGNOSIS — R443 Hallucinations, unspecified: Secondary | ICD-10-CM

## 2019-03-03 MED ORDER — NUPLAZID 34 MG PO CAP
34 mg | ORAL_CAPSULE | Freq: Every day | ORAL | 5 refills | Status: DC
Start: 2019-03-03 — End: 2019-03-17

## 2019-03-03 MED ORDER — OXYBUTYNIN CHLORIDE 5 MG PO TR24
5 mg | ORAL_TABLET | Freq: Every day | ORAL | 3 refills | 12.00000 days | Status: DC
Start: 2019-03-03 — End: 2019-04-15

## 2019-03-03 NOTE — Assessment & Plan Note
Due to hallucination I recommended discontinuing Symmetrel (amantadine). Patient was referred to the programming nurse to have IPG checked and reprogrammed.

## 2019-03-03 NOTE — Assessment & Plan Note
I recommended Vit D/Calcium supplementation, increase water consumption, use of assistance devices (walker), home safety and regular exercise.

## 2019-03-03 NOTE — Assessment & Plan Note
Her symptoms are stable. No medication changes were recommended during the current  visit.

## 2019-03-03 NOTE — Patient Instructions
WHAT IS NUPLAZID USED FOR?  NUPLAZID is a prescription medicine used to treat hallucinations and delusions associated with Parkinsons disease.   WHAT WARNINGS SHOULD I KNOW ABOUT NUPLAZID?  NUPLAZID may increase the risk of changes to your heart rhythm. This risk may increase if  NUPLAZID is taken with certain other medications known to change heart rhythm. Keep a list of all the medicines you take and show your doctor and pharmacist the list when you get a new medicine.  WHO SHOULD NOT TAKE NUPLAZID?  Do not take NUPLAZID if you have certain kinds of heart conditions such as: An irregular heartbeat, Slow heart rate  WHAT SHOULD I TELL MY DOCTOR?  Before taking NUPLAZID, tell your doctor if you: Have reduced kidney function. NUPLAZID is not recommended if you have severe kidney problems Have reduced liver function. NUPLAZID is not recommended if you have liver problems  WHAT OTHER MEDICINES MIGHT INTERACT WITH NUPLAZID?  Other medicines may affect how NUPLAZID (pimavanserin) works. Also, some medicines  should not be taken with NUPLAZID. Your doctor can tell you if it is safe to take NUPLAZID with  your other medicines. Do not start or stop any medicines while taking NUPLAZID without  talking to your doctor first.  WHAT ARE THE POSSIBLE SIDE EFFECTS OF NUPLAZID?  The most common side effects of NUPLAZID are swelling in the legs or arms, nausea, and feeling confused. Other possible side effects are hallucinations, constipation, and changes to normal walking. These are not all the possible side effects of NUPLAZID. For more information, ask your doctor or pharmacist about this medicine.  Take two 17-mg tablets of NUPLAZID (34 mg) once per day, at any time, or as prescribed by your doctor. You can take NUPLAZID with or without food    Elderly people with dementia-related psychosis (having lost touch with reality due to confusion and memory loss) taking antipsychotic drugs are at an increased risk of death.

## 2019-03-03 NOTE — Progress Notes
Alexis Matthews is a 73 y.o. female.    Subjective:             History of Present Illness     Parkinson's Disease Follow-Up Visit    Since my last visit I am: Slighly worse    Symptoms Scale:    Memory problems: Mild, bothersome  Hallucinations/delusions: Mild, illusions  Depression: None  Anxiety: Slight, not bothersome  Apathy: Slight, some loss of interest  Impulsive behavior: None  Nighttime sleep: No difficulty  Daytime sleepiness: Mild, after taking my medications  Vivid dreams: Mild, frequently not bothersome  REM sleep behavior disorder: Moderate, frequently act dreams but no injuries  Restless leg syndrome: None  Pain or muscle cramps: Moderate, back pain limits activities  Urination: Mild, go frequently, bothersome  Constipation: Moderate, need OTC medication  Dizziness or lightheadedness: Slight, occasional on standing  Tiredness/Fatigue: Slight, reduced stamina  Falling: Marked, more than once a week  Personal care assistance: Moderate, I have some difficulty and need some help daily    Total Score:  Total Score: 30    Assistive Devices:  Assistive devices for getting around: Walker    OFF Time:    OFF time: No    Dyskinesia:    Dyskinesia while awake: No    Employment Status:    Employment: Retired - not due to PD          Review of Systems   Gastrointestinal: Positive for constipation. Negative for abdominal distention, abdominal pain, anal bleeding, blood in stool, diarrhea, nausea, rectal pain and vomiting.   Genitourinary: Positive for enuresis.   Neurological: Positive for speech difficulty and light-headedness.         Objective:         ??? famotidine (PEPCID) 20 mg tablet Take 20 mg by mouth at bedtime daily.   ??? levothyroxine (SYNTHROID) 100 mcg tablet Take 100 mcg by mouth daily 30 minutes before breakfast. Indications: BRAND NAME ONLY   ??? LORazepam (ATIVAN) 0.5 mg tablet Take 0.5 mg by mouth at bedtime daily.   ??? melatonin 3 mg tab Take 3 mg by mouth at bedtime daily. ??? omeprazole DR(+) (PRILOSEC) 40 mg capsule Take 40 mg by mouth daily before breakfast.   ??? oxybutynin XL (DITROPAN XL) 5 mg tablet Take one tablet by mouth daily.   ??? PARoxetine (PAXIL) 20 mg tablet Take 1 tablet by mouth daily.   ??? pimavanserin (NUPLAZID) 34 mg cap capsule Take one capsule by mouth daily.     Vitals:    03/03/19 1411 03/03/19 1412   BP: (!) 141/80 110/68   BP Source: Arm, Left Upper Arm, Left Upper   Patient Position: Sitting Standing   Pulse: 73 96   Weight: 65.3 kg (144 lb)    Height: 165.1 cm (65)      Body mass index is 23.96 kg/m???.     Physical Exam    EXAMINATION:     ORIENTATION: Alert and Oriented.  Visuospatial/Executive Points: 1/5  Naming Points: 3/3  Attention Points: 4/6  Language Points: 2/3  Abstraction Points: 2/2  Delayed Recall Points: 3/5  Orientation Points: 6/6  Education <or= 12 Years: 0/1  MOCA Score (out of 30): 21    Cranial Nerves: II-XII Unremarkable except reduced facial expression and soft voice.        Right Left   Bradkinesia  Mild Mild   Tremor Hands Resting None None    Postural None None    Kinetic None None  Tremor Other: None  Dyskinesia: None  Muscle Strength: Unremarkable  Gait: Needs assistance to walk    Unified Parkinson's Disease Rating Scale: OFF  Total Mentation Score: 5  Total Activities of Daily Living Score: 21  Total Motor Exam: 32  Total UPDRS Score: 58    Hoehn & Yahr Staging: 4    Schwab & [place] Staging: 70 %    Geriatric Depression Scale: 12 suggesting mild depression.    Epworth Sleepiness Scale: 14 suggesting daytime drowsiness.    PDQ-39 suggests that his symptoms impact his quality of life by:  PDQ 39 IMPACT  Mobility Percent: 42.5 %  ADL Percent: 20.83 %  Emotional Percent: 37.5 %  Social Stigma Percent: 18.75 %  Social Support Percent: 8.33 %  Cognition Percent: 43.75 %  Communication Percent: 58.33 %  Body Discomfort Percent: 33.33 %  PDQ Total Percent: 33.97 %        Falls Risk Score:   12  High risk of falls Timed Up and Go with Pushing: 21 Seconds(walker)           Assessment and Plan:    Problem   Parkinson's disease with Bilateral STN Implants    Symptoms began in 2001, initial symptoms was right hand tremor. Diagnosed with Parkinson's disease in 2001. Atypical PD Features: None  Patient has difficulty tolerating Sinemet (carbidopa/levodopa) and dopamine agonists.   09/29/08 UPDRS Motor OFF 36  Underwent bilateral Subthalamic stimulator implants in 2010 for bothersome tremor with marked improvement.   03/24/2012 Mentation Score: 2 Activities of Daily Living Score: 4 Motor Exam:19 Total UPDRS Score: 25    PDQ 39 IMPACT PDQ Total Percent: 6.41 %  04/30/2013  Mentation Score: 3 Activities of Daily Living Score: 13 Motor Exam: 28 Total UPDRS Score: 44   PDQ 39 IMPACT PDQ Total Percent: 5.77 %   05/06/2014 Total Mentation Score: 5 Total Activities of Daily Living Score: 7 Total Motor Exam: 18 Total UPDRS Score: 30  PDQ Total Percent: 17.95 %  12/06/2014 PDQ8 Total %: 25   02/08/2015 PDQ8 Total %: 16   Patient was evaluated by Occupational Therapist, Kelli Reiling.    05/27/2015 Total Mentation Score: 4 Total Activities of Daily Living Score: 16 Total Motor Exam: 34 Total UPDRS Score: 54   PDQ Total Percent: 33.33 %   Stimulator Settings:  Patient was evaluated by Occupational Therapist, Harvin Hazel Reiling.    12/07/2015 PDQ8 Total %: 12   07/17/2016 Total Mentation Score: 4 Total Activities of Daily Living Score: 13 Total Motor Exam: 33 Total UPDRS Score: 50   PDQ Total Percent: 33.33 %   02/12/2017 PDQ8 Total %: 19   Nausea and fogginess with Sinemet (carbidopa/levodopa) 25/100   09/10/2017 PDQ Total Percent: 35.26 %   Total Mentation Score: 4 Total Activities of Daily Living Score: 15 Total Motor Exam: 29 Total UPDRS Score: 48   01/13/2018 Side effects to Mirapex (pramipexole)   06/11/2018 PDQ8 Total %: 34   Rytary (carbidopa/levodopa) extended release capsules causing side effects  03/03/2019 PDQ Total Percent: 33.97 % Total Mentation Score: 5 Total Activities of Daily Living Score: 21 Total Motor Exam: 32 Total UPDRS Score: 58      Hallucination   At Risk for Falls    09/08/2015 Fall Risk assessment Score 11  01/13/2018 Falls Risk Score 10   06/11/2018 Falls Risk Score:   9 Timed Up and Go with Pushing: 19 Seconds(w/ walker)     03/03/2019 Falls Risk Score:   12 Timed Up  and Go with Pushing: 21 Seconds(walker)        Depression    05/06/2014 Geriatric Depression Scale: 13  12/06/2014 On Paxil (paroxetine)   05/27/2015 Wellbutrin (bupropion) caused nausea  05/27/2015 Geriatric Depression Scale: 12 Does not like Paxil (paroxetine)   07/17/2016 Geriatric Depression Scale: 9   09/10/2017 Geriatric Depression Scale: 16   03/03/2019 Geriatric Depression Scale: 12          Parkinson's disease with Bilateral STN Implants  Due to hallucination I recommended discontinuing Symmetrel (amantadine). Patient was referred to the programming nurse to have IPG checked and reprogrammed.     At risk for falls  I recommended Vit D/Calcium supplementation, increase water consumption, use of assistance devices (walker), home safety and regular exercise.      Depression  Her symptoms are stable. No medication changes were recommended during the current  visit.      Hallucination  Her UA showed possible UTI and I asked her to follow up with Primary Care Physician and if she continues to have hallucination we will add Nuplazid (pimavanserin). Patient was started on Nuplazid (pimavanserin). Side effects of Nuplazid (pimavanserin) were discussed with the patient and she was given a list of common side effects.        Patient will follow up in approximately 6 weeks.

## 2019-03-04 ENCOUNTER — Ambulatory Visit: Admit: 2019-03-03 | Discharge: 2019-03-04

## 2019-03-04 DIAGNOSIS — Z9181 History of falling: Secondary | ICD-10-CM

## 2019-03-04 DIAGNOSIS — F3289 Other specified depressive episodes: Secondary | ICD-10-CM

## 2019-03-04 DIAGNOSIS — R443 Hallucinations, unspecified: Secondary | ICD-10-CM

## 2019-03-05 ENCOUNTER — Encounter: Admit: 2019-03-05 | Discharge: 2019-03-05

## 2019-03-06 ENCOUNTER — Encounter: Admit: 2019-03-06 | Discharge: 2019-03-06

## 2019-03-06 NOTE — Progress Notes
No prior authorization was required for Nuplazid for Alexis Matthews.  The copay is $847.06.    Copay assistance unlimited until the end of year was obtained for the patient using grant from [employer] and now the copay is $0.00.  Alexis Matthews has stated this copay is affordable.  The specialty pharmacy will pursue additional copay assistance as necessary.  The specialty pharmacy will reach out to the ambulatory clinical pharmacist if the copay becomes unaffordable.    The medication will be delivered to patient's prescription address per the patient's request pending education and any test needed     [facility]  [phone]

## 2019-03-06 NOTE — Progress Notes
Parkinson's Disease . Their arrival mode was walker and they were accompanied by: husband, Ed.    Location of Lead Implant: Bilateral Subthalamic Nucleus (STN)   Type of internally implanted generator:Medtronic SC    DEVICE INFORMATION:  RIGHT BRAIN/ LEFT BODY LEFT BRAIN/ RIGHT BODY   Battery Voltage: 2.97  System Continuity Checked: Yes  Identified Problems: None  Battery Status: OK  Referred for Surgical Replacement: No Battery Voltage: 2.97  System Continuity Checked: Yes  Identified Problems: None  Battery Status: OK  Referred for Surgical Replacement: No     ------------------------------------------------------------------------------------------------------------------------------    PROGRESS NOTE:  Patient is here for yealry STN f/u.  Had bilateral deep brain stimlators placed for Parkinsons disease in 2010.  Reports worsening of Parkinosn symptoms over the past 6 month.  Reprots no off time and denies dysinesias. No changes made to stimuation parameteres.   Did call on 05.22.20 with c/o hallucinations which are new and Dr Erline Levine suggested lab work but it wa never recieved.  She also called on 06.01 and order was faxed the second time to her facility.  Today we called and did get the results.It appers she has a UTI. Dr Erline Levine suggested she follow with PCP, hold the Nuplazid,dc the amnatadine and hold the oxybutrin for now until hallucinations resolve .  He also suggested a 6 week f/u which was scheduled and provided.  After visit summary provided   Found husbands cogintion possibly impaired. All instructions provided in writing along with f/u appointments.   ------------------------------------------------------------------------------------------------------------------------------    RIGHT BRAIN/ LEFT BODY LEFT BRAIN/ RIGHT BODY   PRE-VISIT SETTINGS (ACTIVE GROUP) PRE-VISIT SETTINGS  (ACTIVE GROUP)   Amplitude: 2.9   Pulse Width: 60 micro secs  Rate: 145 pps   Load Impedance: - ohms   Contacts: 1- Case + Amplitude: 2.8  Pulse Width: 60 micro secs  Rate: 145 pps   Load Impedance: - ohms   Contacts: 1-  Case +       RIGHT BRAIN/ LEFT BODY LEFT BRAIN/ RIGHT BODY   POST-VISIT SETTINGS (ACTIVE GROUP) POST-VISIT SETTINGS (ACTIVE GROUP)   Amplitude: 2.9   Pulse Width: 60 micro secs  Rate: 145 pps   Load Impedance: 916 ohms   Contacts: 1-  Case + Amplitude: 2.8   Pulse Width: 60 micro secs  Rate: 145 pps   Load Impedance: 811 ohms   Contacts: 1-  Case +     Patient Programmer Limits For: Amplitude +0.8, -0.6    DBS analysis without programming: Yes    DBS Programming Time  Start: n/a Stop: n/a    ------------------------------------------------------------------------------------------------------------------------------    Patient Caregiver Education:     ??? Department Contacts/phone numbers: Yes  ??? Safety for diathermy: No  ??? Patient resources: Yes  ??? Diagnostics: No  ??? Medical alert identification: No  ??? Exercise: Yes  ??? Hardware maintenance/problems: No  ??? Dental care: No  ??? Diet: Yes    Patient programmer education:    ??? On/off status: Yes  ??? Self Adjustments: Yes  ??? Battery status: Yes       Educational Goals/Outcomes; Patient/Family will verbalize understanding to the following:           Yes/No          Comments   Disease Process     Yes    Diagnostic Procedures     Yes      Treatment Plan     Yes    Medications     Yes  Nutrition/Diet     Yes    Personal Hygiene     Yes    Side Effect Management     Yes    Medical Equipment     Yes    Safety     Yes    Discharge Instructions     Yes    Other

## 2019-03-06 NOTE — Telephone Encounter
Called facility while patinet was here for visit this week as we never received lab work that was drawn. Labwork was requested to be sent here but was never recieved, Did review lab work after patient was seen by Dr Eldridge Dace and he suggested we hold the Nuplazid for now.  He also wanted patient to continue to hold amantadine and oxybutrin until hallucinations resolve. Patient does have a UTI.  Instructed patient and wife to call back if the hallucinations continue to be bothersome. Patient was scheduled for f/u here in 6 weeks.

## 2019-03-10 ENCOUNTER — Encounter: Admit: 2019-03-10 | Discharge: 2019-03-10

## 2019-03-17 ENCOUNTER — Encounter: Admit: 2019-03-17 | Discharge: 2019-03-17

## 2019-03-17 NOTE — Progress Notes
Clinical Pharmacist Note - [facility]     To close out communication. Was to follow up on if patient was having hallucinations.  Patient was recently treated for UTI and had hallucinations with UTI.    Saw  that RN contacted patient and spouse on 03/06/2019 and instructed them to call back clinic if hallucinations return. Nuplazid was never started/filled.    [facility] Removal    Alexis Matthews  is being removed from the [name] specialty pharmacy patient management program due to patient stopping the medication  Pimavanserin (NUPLAZID);  for Patient never started medication - as suspected hallucinations from UTI.    The medication has been removed from the patient's active medication list.  Should the patient's care team wish to restart the medication, a new prescription will need to be sent to [facility]. Should the patient be able to/choose to fill specialty medications at [facility] in the future, these services will be re-initiated (unless the patient decides to opt-out of the program).     Patient/Caregiver is aware and patient is no longer taking the medication. The patient's questions and concerns were addressed as needed. The patient was instructed to contact their health care provider if their symptoms or health problems require medical attention.    The patient's provider is aware that they are no longer taking the medication.    Clinical pharmacist services are still available this patient of the [facility].       Follow Up:  - With clinical pharmacist as needed    -With PCP and specialists as instructed and as needed    Patient has my contact information and instructed to contact clinic for any additional questions or concerns.       Thank you for the opportunity to care for this patient.    Arbie Cookey, PharmD, BCACP  Clinical Pharmacist  Office: [phone] [facility]  [facility]

## 2019-03-23 ENCOUNTER — Encounter: Admit: 2019-03-23 | Discharge: 2019-03-23

## 2019-03-23 MED ORDER — NUPLAZID 34 MG PO CAP
34 mg | ORAL_CAPSULE | Freq: Every day | ORAL | 11 refills | Status: DC
Start: 2019-03-23 — End: 2019-05-13
  Filled 2019-03-25: qty 30, 30d supply, fill #1

## 2019-03-23 NOTE — Telephone Encounter
Ok to start Nuplazid (pimavanserin) 34 mg daily

## 2019-03-23 NOTE — Telephone Encounter
Patients husband called stating he thinks he needs that new med.  He was approved for assistance for Nuplazid after Lindas' last visit.  He said she was treated for her UTI and that was about 1 week ago.  She is still hallucinating and they are bother her.  Se is seeing thing mainly in the daytime that are not real. What would you suggest?

## 2019-03-23 NOTE — Telephone Encounter
Sent new rx to pharmacy and called patient to let her know that the medication should come in the mail. Provided direction to take for 6 weeks before full benefit is noticed.  Instructed husband Ed to call me back for any further questions and or concerns.

## 2019-03-24 ENCOUNTER — Encounter: Admit: 2019-03-24 | Discharge: 2019-03-24

## 2019-03-24 NOTE — Progress Notes
The Prior Authorization for Nuplazid was approved for DORETTA REMMERT from 12-23-2018 to 06-22-2019.        There was assistance in place previously from The Assistance fund that will cover the copay of the [facility]   Ext [phone]

## 2019-03-24 NOTE — Progress Notes
Clinical Pharmacist Note - [facility]    Reason(s) for visit:    - Pharmacy Initial medication Assessment & Education: Pimavanserin (NUPLAZID);     Date of Service: 03/24/2019    Visit Type: Telephone  Accompanied by: Sherron Monday to Spouse (Ed)      Assessment and Plan:    1. Parkinson Disease  :      An initial assessment and education of Nuplazid has been completed      Education provided on: Nuplazid    Instructed Patient and/or Proxy or Caregiver :    1. Systems developer ([facility]) phone: [phone] , should be reaching out to you to set up fill of Nuplazid.  Please contact them with questions on filling    Patient/Caregiver verbalized understanding of plan.    -message sent to Pharmacy Patient Advocate (PPA) at [facility] to please reach out to spouse to set up shippment    Follow Up:  - Patient will be contacted within 60 days of medication dispense for re-assessment of  Pimavanserin (NUPLAZID);  by clinical pharmacist; and as needed    -With PCP and specialists as instructed and as needed    Patient has my contact information and instructed to contact clinic for any additional questions or concerns.       Thank you for the opportunity to care for this patient.    Arbie Cookey, PharmD, BCACP  Clinical Pharmacist  Office: [phone]    [facility]  [facility]        INITIAL VISIT      Subjective Alexis Matthews:  Alexis Matthews is a 73 y.o. female .    History of Present Illness     Dr. Erline Levine to start Nuplazid on 03/24/2019 due to spouse report that patient's return of formed visual hallucinations that are bothsome despite treatment of UTI.    Spouse reports patient's hallucinations never stopped through treatment of UTI and that the did another urine check at provider office and was told they did not see anything/thought UTI gone but going to send to lab to double check. Spouse manages patients medications and denies any medication changes since visit with Dr. Erline Levine on 03/03/2019, outside of Macrobid antibiotic for urinary tract infection.      Allergies   Allergies   Allergen Reactions   ??? Latex SWELLING   ??? Primidone MENTAL STATUS CHANGES     Does not like the feeling it gives her feels like I'm outside my body.   ??? Celexa [Citalopram] STOMACH UPSET   ??? Flu Vaccine [Influenza Virus Vaccines] NAUSEA AND VOMITING   ??? Lisinopril SEE COMMENTS     Hypotension and stomach cramps   ??? Neurontin [Gabapentin] DIZZINESS     Falls         Vitals (recent):  There were no vitals filed for this visit.    Ht Readings from Last 3 Encounters:   03/03/19 165.1 cm (65)   10/03/18 167.6 cm (66)   07/09/18 172.7 cm (68)    BMI Readings from Last 1 Encounters:   03/03/19 23.96 kg/m???      Wt Readings from Last 3 Encounters:   03/03/19 65.3 kg (144 lb)   10/03/18 64.1 kg (141 lb 6.4 oz)   07/09/18 67.2 kg (148 lb 3.2 oz)    BP Readings from Last 5 Encounters:   03/03/19 110/68   10/03/18 155/88   07/09/18 (!) 184/95  06/11/18 139/85   01/13/18 (!) 155/91        Labs:  Lab Results   Component Value Date/Time    NA 139 07/09/2018 12:30 PM    K 3.6 07/09/2018 12:30 PM    CL 106 07/09/2018 12:30 PM    CO2 23 07/09/2018 12:30 PM    GAP 10 07/09/2018 12:30 PM    BUN 13 07/09/2018 12:30 PM    CR 0.98 07/09/2018 12:30 PM    GLU 100 07/09/2018 12:30 PM    CA 9.3 07/09/2018 12:30 PM    MG 2.2 03/09/2013 04:17 AM    ALBUMIN 5.1 (H) 03/04/2013 08:10 PM    TOTPROT 7.6 03/04/2013 08:10 PM    ALKPHOS 86 03/04/2013 08:10 PM    AST 17 03/04/2013 08:10 PM    ALT 11 03/04/2013 08:10 PM    TOTBILI 1.1 03/04/2013 08:10 PM    GFR 56 (L) 07/09/2018 12:30 PM    GFRAA >60 07/09/2018 12:30 PM       Medication Reconciliation  A medication history and reconciliation was performed (including prescription medications, supplements, over the counter, and herbal products). The medication list was updated and the patient's current medication list is listed below.    Home Medications    Medication Sig   famotidine (PEPCID) 20 mg tablet Take 20 mg by mouth at bedtime daily.   levothyroxine (SYNTHROID) 100 mcg tablet Take 100 mcg by mouth daily 30 minutes before breakfast. Indications: BRAND NAME ONLY   LORazepam (ATIVAN) 0.5 mg tablet Take 0.5 mg by mouth at bedtime daily.   melatonin 3 mg tab Take 3 mg by mouth at bedtime daily.   omeprazole DR(+) (PRILOSEC) 40 mg capsule Take 40 mg by mouth daily before breakfast.   oxybutynin XL (DITROPAN XL) 5 mg tablet Take one tablet by mouth daily.   PARoxetine (PAXIL) 20 mg tablet Take 1 tablet by mouth daily.   pimavanserin (NUPLAZID) 34 mg cap capsule Take one capsule by mouth daily.       Drug-Drug Interactions  No new significant drug-drug or drug-food interactions were identified.     Social History     Socioeconomic History   ??? Marital status: Married     Spouse name: Ed   ??? Number of children: 2   ??? Years of education: Not on file   ??? Highest education level: Not on file   Occupational History   ??? Not on file   Tobacco Use   ??? Smoking status: Passive Smoke Exposure - Never Smoker   ??? Smokeless tobacco: Never Used   Substance and Sexual Activity   ??? Alcohol use: No     Alcohol/week: 0.0 standard drinks   ??? Drug use: No   ??? Sexual activity: Not on file   Other Topics Concern   ??? Military Service Not Asked   ??? Blood Transfusions Not Asked   ??? Caffeine Concern Not Asked   ??? Occupational Exposure Not Asked   ??? Hobby Hazards Not Asked   ??? Sleep Concern Yes   ??? Stress Concern Yes   ??? Weight Concern Not Asked   ??? Special Diet Not Asked   ??? Back Care Yes   ??? Exercise Not Asked   ??? Bike Helmet Not Asked   ??? Seat Belt Not Asked   ??? Self-Exams Not Asked   Social History Narrative    06/2014    Married to Ed w/ 2 sons    4 brothers, 3 deceased, 2  w/cancer, 1 w/Crohns. 1 sister, good health    Dad MI w/CABG at 61, smoker, died at 20 Mom dies at 63         = = = = = = = = = = = = = = = = = = = = = = = = = = = = = = = = = = = = = = = = = = = = = = = = = = = =    Social History is significant for:   Caregiver vs. Independent living?: Independent with spouse  Assistive device(s) for getting around: Walker  Tobacco use?: no  Alcohol use?: no  Recreational drug use?: assess at future visits(s) as appropriate    Past Medical History is significant for:     Medical History:   Diagnosis Date   ??? Acid reflux    ??? Adverse drug reaction    ??? Anxiety and depression    ??? Balance problem 2016   ??? Enlarged thyroid    ??? Foot callus 2014    left   ??? Heart murmur    ??? Hip fracture (HCC)    ??? Hypertension    ??? Palpitation    ??? Parkinson disease (HCC)    ??? Spinal stenosis    ??? Wears hearing aid in both ears        Surgical History:   Procedure Laterality Date   ??? LAPAROSCOPY  1973   ??? GASTRIC FUNDOPLICATION  2008   ??? DEEP BRAIN STIMULATOR PLACEMENT  2010    STN implants   ??? PR OPEN TX RIB FRACTURE W/INT FIX UNI 1-2 RIBS Left Feb 2017   ??? REMOVAL OF CURRENT SINGLE CHANNEL DEEP BRAIN STIMULATION INTERMITTENT PULSE GENERATORS, IMPLANTATION OF NEW SINGLE CHANNEL DEEP BRAIN STIMULATION INTERMITTENT PULSE GENERATORS, RIGHT CHEST AND LEFT CHEST Bilateral 10/03/2018    Performed by Alvino Blood, MD at [facility]   ??? INTERTROCHANTERIC HIP FRACTURE SURGERY  Jule 12 th, 2014    right hip   ??? SURGERY      Battery replacement in nerve stimulator       Does patient have diagnosis of dementia?: No, but has congitive impairment  Mental status/MOCA (date):   [facility] Neuro MOCA Review 03/03/2019 09/10/2017 07/17/2016   MOCA Score (out of 30) 21 25 29        Pregnancy Status  Pregnancy status was assessed and determined to be: Female: education not applicable.    Movement Exam(s)/Test(s), recent:  Date - Exam/Test - Score/Assessment  --      [facility] Disorders [facility] Specialty Pharmacy Monitoring:  Parkinson Disease with Bilateral STN Implants - Level of disease activity / description of disease: Symptoms began in 2001, initial symptoms was right hand tremor. Diagnosed with Parkinson's disease in 2001. Atypical PD Features: None  Patient has difficulty tolerating Sinemet (carbidopa/levodopa) and dopamine agonists.  --Underwent bilateral Subthalamic stimulator implants in 2010 for bothersome tremor with marked improvement.     Previously trialed agents/medications (Allergy and/or intolerance) for this indication:   -Sinemet (nausea and fogginess)  -Pramipexole (side effects) - 01/13/2018  -Rytary (side effects)    Current medications being used for this indication:   - No - has DBS therapy    Hallucination - Level of disease activity / description of disease: Bothersome hallucinations     Previously trialed agents/medications (Allergy and/or intolerance) for this indication:   -No    Current medications being used for this indication:   - Pimavanserin (  NUPLAZID); to [facility] July 2020     _     _     _     _     _     _     _     _     _     _     _     _     _     _     _    _     _     _     _     _     _     _     _        Safety Monitoring for the following Specialty Medications:  Pimavanserin (NUPLAZID);     Baseline Characteristics (03/24/2019) Monitoring (as indicated)   Baseline ECG:   ECG on 07/03/2018  with QTc interval of 440 msec.  (on Paxil 20mg  and Ditropan XL 10mg ) Hx Heart murmur and palpitation.  Periodic ECG (QTc interval) monitoring: 03/14/2019  Routine ECG for QTc interval monitoring Not required. Monitor as clinically indicated. - educated they can follow up with Primary care provider if they would like heart EKG monitoring after starting Nuplazid   Hx Chronic Kidney Disease?: no    Hx Liver disease?: no    Hx Asthma, COPD or other chronic respiratory condition: No    Hx Glaucoma (monitor IOPs): no    Hallucinations or Psychosis/Psychotic disorder: Formed VH    Hx Impulse Control Disorder(s) (ICD)?: no    Suicidal ideation (SI) or worsening mood?: no Orthostasis?:  no    Additional risk factors: no    Hx=History of    Risk Evaluation and Mitigation Strategy (REMS) Assessment  No REMS program is required for this/these medication(s).   Contraindications  BRICE SCHURZ has no contraindications to this/these medication(s).  Safety Precautions  Safety precautions were evaluated and will be discussed with patient as applicable.  Vaccination Status Assessment     There is no immunization history on file for this patient.  Vaccine history was reviewed with the patient. The patient was reminded about the importance of receiving an annual influenza vaccine as indicated.   = = = = = = = = = = = = = = = = = = = = = = = = = = = = = = = = = = = = = = = = = = = = = = = = = = = =  Logistics and Questions  Confirmed patient address and phone number regarding shipment. Based on the patients' preference the medication(s) will be picked up or shipped from [facility].    Alexis Matthews (and/or proxy/caregiver where applicable) was given the opportunity to ask questions and did not have any questions at this time.      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 The regimen of 34 mg by mouth once daily is planned to continue indefinitely which is appropriate for Alexis Matthews. No renal or hepatic adjustments are required.      At this time the there is no planned dose titration.     Drug-Food Interactions: There are no significant drug-food interactions. This medication can be taken with or without food.  The patient's caregiver has the ability to administer this medication to the patient.      Therapeutic Goal(s):  - Initial reduction in severity and/or frequency of hallucinations and/or delusions after at least 8 weeks of therapy. Baseline ECG  ECG on 07/03/2018 with QTc interval of 440 msec.      Specialty Medication Counseling- Nuplazid    Alexis Matthews / Caregiver was contacted via telephone  to provide medication education on their new specialty medication. Patient/Caregiver was educated on the following medication(s): Nuplazid    Provided education on pimavanserin (NUPLAZID), in patient friendly language with emphasis on the following:   -Purpose, expected benefit (up to 6 weeks to see effect)   -Dosing   -Oral: 34mg  once daily (without regard to meals)  -Side effects  -most common: nausea (7%), constipation (4%), peripheral edema (7%), confusion (6%), hallucinations (5%), QTc prolongation   -rare: gait disturbance (2%), orthostatic hypotension, drowsiness, allergy    -Monitoring    - Mental status and Vital signs  - Renal and Liver function (CMP annually and as needed)   - ECG as needed   -Misc.  -Avoid using supplement St. John's Wort.  Ask pharmacist or doctor about supplement   interactions before using supplements.   -Limit/avoid frequent use of grapefruit.     Alexis Matthews / Caregiver was educated on the medication name (brand and generic) pimavanserin (NUPLAZID). The indication for treatment, dose, route, frequency and duration were discussed with the patient. The patient was educated on timely administration of therapy and management of missed doses. Adherence with therapy was discussed with the patient. The patient's ability to be adherent with drug therapies was discussed and the patient was provided options for tools/resources that promote adherence to therapy. The patient's ability to self-administer medication was assessed. The patient was educated on proper administration/injection technique for their therapy and verbalized acceptance and understanding. Appropriate safe-handling, storage, and disposal directions were reviewed with the patient. -Contraindications to therapy, safety precautions, and common side effects were discussed with the patient. They were instructed to seek medical attention immediately if they experience signs of an allergic reaction, including but not limited to: a rash; hives; itching; red, swollen, blistered, or peeling skin with or without fever.   -Requirements of the REMS program were discussed with the patient as applicable.   -Drug-drug and drug-food interactions with the new therapy were assessed and reviewed with the patient. The patient was instructed to speak with their health care provider before starting any new drug, including prescription or over the counter, natural products, or vitamins.  -Appropriate recommended vaccinations were reviewed and discussed with the patient.  -The monitoring and follow-up plan was discussed with the patient. The patient was instructed to contact their health care provider if their symptoms or health problems do not get better or if they become worse.

## 2019-03-24 NOTE — Progress Notes
The Prior Authorization for Nuplazid was submitted for Alexis Matthews via [facility].  Will continue to follow.    [place] Patient Advocate  [phone]

## 2019-03-25 ENCOUNTER — Encounter: Admit: 2019-03-25 | Discharge: 2019-03-25

## 2019-03-27 ENCOUNTER — Encounter: Admit: 2019-03-27 | Discharge: 2019-03-27

## 2019-03-27 NOTE — Telephone Encounter
Serena Croissant called with concern for his wife, Alexis Matthews. Reports morning after taking first dose of Nuplazid (pimavanserin), she awoke very weak and dizzy, and "not quite herself."  He also mentioned she spoke of hallucination of diarrhea that was not actually happening.  I asked for a blood pressure check with a home monitor: 133/71, pulse 74, which Ed says is a little low for her. During our conversation he reported that she was beginning to feel better and getting some energy back. I explained that this medication can cause fatigue, confusion, nausea, as well the potential for dizziness when standing (orthostatic hypotension). Counseled mitigation of orthostatic hypotension, as well as potential for fatigue and nausea to resolve with time on medication. Currently, no emergent distress.  Encouraged to contact me if symptoms change, or if worsen to call for emergency help.    Donna Christen, [facility]

## 2019-03-30 ENCOUNTER — Encounter: Admit: 2019-03-30 | Discharge: 2019-03-30

## 2019-03-30 NOTE — Progress Notes
Clinical Pharmacist Note - [facility]    Reason(s) for visit:    - Medication questions/issues    Date of Service: 03/30/2019    Visit Type: Telephone - Left voice message (LVM) on 03/30/2019 for spouse of patient. That I heard from [facility] pharmacy that Mersadez was not doing well last week [03/27/2019] and wanted to check if she was feeling any better. That if she is still having issues to follow up with her primary care doctor if they have not done so already. Then if needed may contact our clinic.      Accompanied by: N/A        Follow Up:  - Patient will be contacted within 60 days of medication dispense for re-assessment of  Pimavanserin (NUPLAZID);  by clinical pharmacist; and as needed    -With PCP and specialists as instructed and as needed    Patient has my contact information and instructed to contact clinic for any additional questions or concerns.      Thank you for the opportunity to care for this patient.    Arbie Cookey, PharmD, BCACP  Clinical Pharmacist  Office: [phone]    [facility]  [facility]      FOLLOW UP     Subjective Alexis Matthews:  Alexis Matthews is a 73 y.o. female .    History of Present Illness     On 03/27/2019 patient had contacted [facility] pharmacy and spoke with Clinical pharmacist Josh.     Reports that: Reports morning after taking first dose of Nuplazid (pimavanserin), she awoke very weak and dizzy, and not quite herself.  He also mentioned she spoke of hallucination of diarrhea that was not actually happening.  [facility] pharmacist asked for a blood pressure check with a home monitor: 133/71, pulse 74, which Ed says is a little low for her. During our conversation he reported that she was beginning to feel better and getting some energy back. I explained that this medication can cause fatigue, confusion, nausea, as well the potential for dizziness when standing (orthostatic hypotension). Counseled mitigation of orthostatic hypotension, as well as potential for fatigue and nausea to resolve with time on medication. Currently, no emergent distress.         Allergies   Allergies   Allergen Reactions   ??? Latex SWELLING   ??? Primidone MENTAL STATUS CHANGES     Does not like the feeling it gives her feels like I'm outside my body.   ??? Celexa [Citalopram] STOMACH UPSET   ??? Flu Vaccine [Influenza Virus Vaccines] NAUSEA AND VOMITING   ??? Lisinopril SEE COMMENTS     Hypotension and stomach cramps   ??? Neurontin [Gabapentin] DIZZINESS     Falls         Vitals (recent):  There were no vitals filed for this visit.    Ht Readings from Last 3 Encounters:   03/03/19 165.1 cm (65)   10/03/18 167.6 cm (66)   07/09/18 172.7 cm (68)    BMI Readings from Last 1 Encounters:   03/03/19 23.96 kg/m???      Wt Readings from Last 3 Encounters:   03/03/19 65.3 kg (144 lb)   10/03/18 64.1 kg (141 lb 6.4 oz)   07/09/18 67.2 kg (148 lb 3.2 oz)    BP Readings from Last 5 Encounters:   03/03/19 110/68   10/03/18 155/88   07/09/18 (!) 184/95   06/11/18 139/85  01/13/18 (!) 155/91        Labs:  Lab Results   Component Value Date/Time    NA 139 07/09/2018 12:30 PM    K 3.6 07/09/2018 12:30 PM    CL 106 07/09/2018 12:30 PM    CO2 23 07/09/2018 12:30 PM    GAP 10 07/09/2018 12:30 PM    BUN 13 07/09/2018 12:30 PM    CR 0.98 07/09/2018 12:30 PM    GLU 100 07/09/2018 12:30 PM    CA 9.3 07/09/2018 12:30 PM    MG 2.2 03/09/2013 04:17 AM    ALBUMIN 5.1 (H) 03/04/2013 08:10 PM    TOTPROT 7.6 03/04/2013 08:10 PM    ALKPHOS 86 03/04/2013 08:10 PM    AST 17 03/04/2013 08:10 PM    ALT 11 03/04/2013 08:10 PM    TOTBILI 1.1 03/04/2013 08:10 PM    GFR 56 (L) 07/09/2018 12:30 PM    GFRAA >60 07/09/2018 12:30 PM       Home Medications    Medication Sig   famotidine (PEPCID) 20 mg tablet Take 20 mg by mouth at bedtime daily.   levothyroxine (SYNTHROID) 100 mcg tablet Take 100 mcg by mouth daily 30 minutes before breakfast. Indications: BRAND NAME ONLY   LORazepam (ATIVAN) 0.5 mg tablet Take 0.5 mg by mouth at bedtime daily.   melatonin 3 mg tab Take 3 mg by mouth at bedtime daily.   omeprazole DR(+) (PRILOSEC) 40 mg capsule Take 40 mg by mouth daily before breakfast.   oxybutynin XL (DITROPAN XL) 5 mg tablet Take one tablet by mouth daily.   PARoxetine (PAXIL) 20 mg tablet Take 1 tablet by mouth daily.   pimavanserin (NUPLAZID) 34 mg cap capsule Take one capsule by mouth daily.       Medical History:   Diagnosis Date   ??? Acid reflux    ??? Adverse drug reaction    ??? Anxiety and depression    ??? Balance problem 2016   ??? Enlarged thyroid    ??? Foot callus 2014    left   ??? Heart murmur    ??? Hip fracture (HCC)    ??? Hypertension    ??? Palpitation    ??? Parkinson disease (HCC)    ??? Spinal stenosis    ??? Wears hearing aid in both ears

## 2019-04-15 ENCOUNTER — Encounter: Admit: 2019-04-15 | Discharge: 2019-04-15

## 2019-04-15 DIAGNOSIS — Z974 Presence of external hearing-aid: Secondary | ICD-10-CM

## 2019-04-15 DIAGNOSIS — L84 Corns and callosities: Secondary | ICD-10-CM

## 2019-04-15 DIAGNOSIS — M4800 Spinal stenosis, site unspecified: Secondary | ICD-10-CM

## 2019-04-15 DIAGNOSIS — F419 Anxiety disorder, unspecified: Secondary | ICD-10-CM

## 2019-04-15 DIAGNOSIS — R443 Hallucinations, unspecified: Secondary | ICD-10-CM

## 2019-04-15 DIAGNOSIS — R011 Cardiac murmur, unspecified: Secondary | ICD-10-CM

## 2019-04-15 DIAGNOSIS — R002 Palpitations: Secondary | ICD-10-CM

## 2019-04-15 DIAGNOSIS — K219 Gastro-esophageal reflux disease without esophagitis: Secondary | ICD-10-CM

## 2019-04-15 DIAGNOSIS — S72009A Fracture of unspecified part of neck of unspecified femur, initial encounter for closed fracture: Secondary | ICD-10-CM

## 2019-04-15 DIAGNOSIS — R2689 Other abnormalities of gait and mobility: Secondary | ICD-10-CM

## 2019-04-15 DIAGNOSIS — I10 Essential (primary) hypertension: Secondary | ICD-10-CM

## 2019-04-15 DIAGNOSIS — F3289 Other specified depressive episodes: Secondary | ICD-10-CM

## 2019-04-15 DIAGNOSIS — G20 Parkinson's disease: Secondary | ICD-10-CM

## 2019-04-15 DIAGNOSIS — T50905A Adverse effect of unspecified drugs, medicaments and biological substances, initial encounter: Secondary | ICD-10-CM

## 2019-04-15 DIAGNOSIS — E049 Nontoxic goiter, unspecified: Secondary | ICD-10-CM

## 2019-04-15 MED ORDER — OXYBUTYNIN CHLORIDE 5 MG PO TR24
10 mg | ORAL_TABLET | Freq: Every day | ORAL | 3 refills | 12.00000 days | Status: DC
Start: 2019-04-15 — End: 2019-04-15

## 2019-04-15 MED ORDER — VENLAFAXINE 75 MG PO CP24
75 mg | ORAL_CAPSULE | Freq: Every day | ORAL | 5 refills | Status: DC
Start: 2019-04-15 — End: 2019-04-15

## 2019-04-15 MED ORDER — DULOXETINE 30 MG PO CPDR
30 mg | ORAL_CAPSULE | Freq: Every day | ORAL | 5 refills | 60.00000 days | Status: DC
Start: 2019-04-15 — End: 2019-05-11

## 2019-04-15 NOTE — Progress Notes
Alexis Matthews is a 73 y.o. female.    Subjective:             History of Present Illness     She is not sure about benefit with Nuplazid (pimavanserin) but she has only taken it for 3 weeks.     Parkinson's Disease Follow-Up Visit    Since my last visit I am: Moderately worse    Symptoms Scale:    Memory problems: Marked, affects majority of activites  Hallucinations/delusions: Marked, hallucinations, bothersome  Depression: Mild, occurs due to a reason  Anxiety: Mild, can be bothersome  Apathy: None  Impulsive behavior: None  Nighttime sleep: Mild, wake up to go to bathroom  Daytime sleepiness: Slight, occasionally  Vivid dreams: Moderate, disturbing  REM sleep behavior disorder: Slight, rarely  Restless leg syndrome: None  Pain or muscle cramps: Moderate, back pain limits activities  Urination: Mild, go frequently, bothersome  Constipation: Mild, occasional but can manage  Dizziness or lightheadedness: Slight, occasional on standing  Tiredness/Fatigue: Marked, cannot do majority of activities  Falling: Mild, no more than once a month  Personal care assistance: Moderate, I have some difficulty and need some help daily    Total Score:  Total Score: 36    Assistive Devices:  Assistive devices for getting around: Walker    OFF Time:    OFF time: No    Dyskinesia:    Dyskinesia while awake: No    Employment Status:    Employment: Retired - not due to PD          Review of Systems   HENT: Positive for trouble swallowing.    Gastrointestinal: Positive for diarrhea and nausea. Negative for abdominal distention, abdominal pain, anal bleeding, blood in stool, constipation, rectal pain and vomiting.   Genitourinary: Positive for enuresis. Negative for decreased urine volume, difficulty urinating, dyspareunia, dysuria, flank pain, frequency, genital sores, hematuria, menstrual problem, pelvic pain, urgency, vaginal bleeding, vaginal discharge and vaginal pain.   Neurological: Positive for speech difficulty. Objective:         ??? cyanocobalamin (RUBRAMIN) 1,000 mcg/mL injection Inject 1 mL into the muscle every 30 days.   ??? duloxetine DR (CYMBALTA) 30 mg capsule Take one capsule by mouth daily.   ??? famotidine (PEPCID) 20 mg tablet Take 20 mg by mouth at bedtime daily.   ??? levothyroxine (SYNTHROID) 100 mcg tablet Take 100 mcg by mouth daily 30 minutes before breakfast. Indications: BRAND NAME ONLY   ??? LORazepam (ATIVAN) 0.5 mg tablet Take 0.5 mg by mouth at bedtime daily.   ??? melatonin 3 mg tab Take 3 mg by mouth at bedtime daily.   ??? omeprazole DR(+) (PRILOSEC) 40 mg capsule Take 40 mg by mouth daily before breakfast.   ??? oxybutynin XL (DITROPAN XL) 10 mg tablet Take 10 mg by mouth daily.   ??? pimavanserin (NUPLAZID) 34 mg cap capsule Take one capsule by mouth daily.       Body mass index is 24.13 kg/m???.     Physical Exam    EXAMINATION:     ORIENTATION: Alert and Oriented.    Cranial Nerves: II-XII Unremarkable except reduced facial expression and soft voice.        Right Left   Bradkinesia  Mild Mild   Tremor Hands Resting None None    Postural None None    Kinetic None None           Tremor Other: None  Dyskinesia: None  Muscle Strength: Unremarkable  Gait: Needs assistance to walk         PDQ8 - Quality of Life  Had difficulty getting around in public places?: Sometimes   Had difficulty dressing?: Sometimes   Felt depressed?: Often   Had problems with your close personal relationships?: Occasionally     Had problems with your concentration, for example, when reading or watching TV?: Occasionally   Felt unable to communicate effectively?: Occasionally   Had painful muscle cramps or spasms?: Sometimes   Felt embarrassed in public due to having Parkinson's disease?: Sometimes              PDQ8 Total Score (32 Possible): 14  PDQ8 Total %: 44            Falls Risk Score:   13  High risk of falls            Timed Up and Go with Pushing: 26 Seconds(walker)                                 Assessment and Plan:    Problem Parkinson's disease with Bilateral STN Implants    Symptoms began in 2001, initial symptoms was right hand tremor. Diagnosed with Parkinson's disease in 2001. Atypical PD Features: None  Patient has difficulty tolerating Sinemet (carbidopa/levodopa) and dopamine agonists.   09/29/08 UPDRS Motor OFF 36  Underwent bilateral Subthalamic stimulator implants in 2010 for bothersome tremor with marked improvement.   03/24/2012 Mentation Score: 2 Activities of Daily Living Score: 4 Motor Exam:19 Total UPDRS Score: 25    PDQ 39 IMPACT PDQ Total Percent: 6.41 %  04/30/2013  Mentation Score: 3 Activities of Daily Living Score: 13 Motor Exam: 28 Total UPDRS Score: 44   PDQ 39 IMPACT PDQ Total Percent: 5.77 %   05/06/2014 Total Mentation Score: 5 Total Activities of Daily Living Score: 7 Total Motor Exam: 18 Total UPDRS Score: 30  PDQ Total Percent: 17.95 %  12/06/2014 PDQ8 Total %: 25   02/08/2015 PDQ8 Total %: 16   Patient was evaluated by Occupational Therapist, Kelli Reiling.    05/27/2015 Total Mentation Score: 4 Total Activities of Daily Living Score: 16 Total Motor Exam: 34 Total UPDRS Score: 54   PDQ Total Percent: 33.33 %   Stimulator Settings:  Patient was evaluated by Occupational Therapist, Harvin Hazel Reiling.    12/07/2015 PDQ8 Total %: 12   07/17/2016 Total Mentation Score: 4 Total Activities of Daily Living Score: 13 Total Motor Exam: 33 Total UPDRS Score: 50   PDQ Total Percent: 33.33 %   02/12/2017 PDQ8 Total %: 19   Nausea and fogginess with Sinemet (carbidopa/levodopa) 25/100   09/10/2017 PDQ Total Percent: 35.26 %   Total Mentation Score: 4 Total Activities of Daily Living Score: 15 Total Motor Exam: 29 Total UPDRS Score: 48   01/13/2018 Side effects to Mirapex (pramipexole)   06/11/2018 PDQ8 Total %: 34   Rytary (carbidopa/levodopa) extended release capsules causing side effects  03/03/2019 PDQ Total Percent: 33.97 %   Total Mentation Score: 5 Total Activities of Daily Living Score: 21 Total Motor Exam: 32 Total UPDRS Score: 58 04/15/2019 PDQ8 Total %: 44      Hallucination    04/15/2019 On Nuplazid (pimavanserin)      At Risk for Falls    09/08/2015 Fall Risk assessment Score 11  01/13/2018 Falls Risk Score 10   06/11/2018 Falls Risk Score:   9 Timed Up and Go  with Pushing: 19 Seconds(w/ walker)     03/03/2019 Falls Risk Score:   12 Timed Up and Go with Pushing: 21 Seconds(walker)     04/15/2019 Falls Risk Score:   13 Timed Up and Go with Pushing: 26 Seconds(walker)        Depression    05/06/2014 Geriatric Depression Scale: 13  12/06/2014 On Paxil (paroxetine)   05/27/2015 Wellbutrin (bupropion) caused nausea  05/27/2015 Geriatric Depression Scale: 12 Does not like Paxil (paroxetine)   07/17/2016 Geriatric Depression Scale: 9   09/10/2017 Geriatric Depression Scale: 16   03/03/2019 Geriatric Depression Scale: 12      Anxiety    On lorazepam         Parkinson's disease with Bilateral STN Implants  Her symptoms are stable. No medication changes were recommended during the current  visit. IPG settings were unchanged.     At risk for falls  Patient does not want to go for Physical Therapy.     Anxiety  Her symptoms are stable. No medication changes were recommended during the current  visit.      Hallucination  I recommended continuing it for another 3 weeks.     Depression  I recommended changing Paxil (paroxetine) to Cymbalta (duloxetine). Patient was started on Cymbalta 30 mg for her depression or pain and advised to increase it to 60 mg after one week. Side effects of the medication were discussed with the patient and they were given a list of common side effects.       Patient will follow up in approximately 1 months.

## 2019-04-15 NOTE — Assessment & Plan Note
Her symptoms are stable. No medication changes were recommended during the current  visit.

## 2019-04-15 NOTE — Assessment & Plan Note
I recommended continuing it for another 3 weeks.

## 2019-04-15 NOTE — Assessment & Plan Note
Patient does not want to go for Physical Therapy.

## 2019-04-15 NOTE — Assessment & Plan Note
Her symptoms are stable. No medication changes were recommended during the current  visit. IPG settings were unchanged.

## 2019-04-16 ENCOUNTER — Ambulatory Visit: Admit: 2019-04-15 | Discharge: 2019-04-15

## 2019-04-16 ENCOUNTER — Ambulatory Visit: Admit: 2019-04-15 | Discharge: 2019-04-16

## 2019-04-16 DIAGNOSIS — R443 Hallucinations, unspecified: Secondary | ICD-10-CM

## 2019-04-16 DIAGNOSIS — Z9181 History of falling: Secondary | ICD-10-CM

## 2019-04-16 DIAGNOSIS — F3289 Other specified depressive episodes: Secondary | ICD-10-CM

## 2019-04-16 DIAGNOSIS — G20 Parkinson's disease: Principal | ICD-10-CM

## 2019-04-20 ENCOUNTER — Encounter: Admit: 2019-04-20 | Discharge: 2019-04-20

## 2019-04-20 MED FILL — NUPLAZID 34 MG PO CAP: 34 mg | ORAL | 30 days supply | Qty: 30 | Fill #2 | Status: AC

## 2019-04-22 NOTE — Progress Notes
Parkinson's Disease . Their arrival mode was walker and they were accompanied by: husband, Ed.    Location of Lead Implant: Bilateral Subthalamic Nucleus (STN)   Type of internally implanted generator:Medtronic SC    DEVICE INFORMATION:  RIGHT BRAIN/ LEFT BODY LEFT BRAIN/ RIGHT BODY   Battery Voltage: 2.97  System Continuity Checked: Yes  Identified Problems: None  Battery Status: OK  Referred for Surgical Replacement: No Battery Voltage: 2.96  System Continuity Checked: Yes  Identified Problems: None  Battery Status: OK  Referred for Surgical Replacement: No     ------------------------------------------------------------------------------------------------------------------------------    PROGRESS NOTE:  Patient is here for 6 week f/u after starting Nuplazid.  Patient did not start the drug until 3 weeks ago so she feels it is not doing too much. The patients' husband says the Nuplazid is helping her already and the hallucinations are not as vivid and more tolerable now.  Reports no off time and no dyskinesias daily.Marland Kitchen No changes made to stimulation parameters.   Seen by Dr Erline Levine who suggested stopping the paxil and starting cymbalta 30 mg and to go to 60 mg after 1 week.  f/u appointments made and provided for 1 month.  ------------------------------------------------------------------------------------------------------------------------------    RIGHT BRAIN/ LEFT BODY LEFT BRAIN/ RIGHT BODY   PRE-VISIT SETTINGS (ACTIVE GROUP) PRE-VISIT SETTINGS  (ACTIVE GROUP)   Amplitude: 2.9   Pulse Width: 60 micro secs  Rate: 145 pps   Load Impedance: - ohms   Contacts: 1-  Case + Amplitude: 2.8   Pulse Width: 60 micro secs  Rate: 145 pps   Load Impedance: - ohms   Contacts: 1-  Case +       RIGHT BRAIN/ LEFT BODY LEFT BRAIN/ RIGHT BODY   POST-VISIT SETTINGS (ACTIVE GROUP) POST-VISIT SETTINGS (ACTIVE GROUP)   Amplitude: 2.9   Pulse Width: 60 micro secs  Rate: 145 pps   Load Impedance: 919 ohms   Contacts: 1- Case + Amplitude: 2.8   Pulse Width: 60 micro secs  Rate: 145 pps   Load Impedance: 824 ohms   Contacts: 1-  Case +     Patient Programmer Limits For: Amplitude +0.8, -0.6    DBS analysis without programming: Yes    DBS Programming Time  Start: n/a Stop: n/a    ------------------------------------------------------------------------------------------------------------------------------    Patient Caregiver Education:     ??? Department Contacts/phone numbers: Yes  ??? Safety for diathermy: No  ??? Patient resources: Yes  ??? Diagnostics: Yes  ??? Medical alert identification: Yes  ??? Exercise: Yes  ??? Hardware maintenance/problems: Yes  ??? Dental care: No  ??? Diet: Yes    Patient programmer education:    ??? On/off status: Yes  ??? Self Adjustments: Yes  ??? Battery status: Yes       Educational Goals/Outcomes; Patient/Family will verbalize understanding to the following:           Yes/No          Comments   Disease Process     Yes    Diagnostic Procedures     Yes      Treatment Plan     Yes    Medications     Yes    Nutrition/Diet     Yes    Personal Hygiene     Yes    Side Effect Management     Yes    Medical Equipment     Yes    Safety     Yes    Discharge  Instructions     Yes    Other

## 2019-05-05 ENCOUNTER — Encounter: Admit: 2019-05-05 | Discharge: 2019-05-05

## 2019-05-05 NOTE — Progress Notes
Clinical Pharmacist Note - [facility]    Reason(s) for visit:    - Pharmacy medication Reassessment: Pimavanserin (NUPLAZID); 1st attempt, unable to reach    Date of Service: 05/05/2019    Visit Type: Telephone Left voice message (LVM) on 05/05/2019 , requesting callback and availability. Will attempt call again if do not hear back in ~ 1 week .    Accompanied by: N/A        Follow Up:  - Patient will be contacted within 60 days of medication dispense for re-assessment of  Pimavanserin (NUPLAZID);  by clinical pharmacist; and as needed  - Clinical pharmacist will reach out to patient/proxy in ~ 1 weeks if do not hear back; and as needed    -With PCP and specialists as instructed and as needed    Patient has my contact information and instructed to contact clinic for any additional questions or concerns.       Thank you for the opportunity to care for this patient.    Alexis Matthews, PharmD, BCACP  Clinical Pharmacist  Office: [phone]    [facility]  [facility]        = = = = = = = = = = = = = = = = = = = = = = = = = = = = = = = = = = = = = = = = = = = = = = = = = = = =    Social History is significant for:   Caregiver vs. Independent living?: Independent with spouse  Assistive device(s) for getting around: Walker  Tobacco use?: no  Alcohol use?: no  Recreational drug use?: assess at future visits(s) as appropriate    Past Medical History is significant for:     Medical History:   Diagnosis Date   ??? Acid reflux    ??? Adverse drug reaction    ??? Anxiety and depression    ??? Balance problem 2016   ??? Enlarged thyroid    ??? Foot callus 2014    left   ??? Heart murmur    ??? Hip fracture (HCC)    ??? Hypertension    ??? Palpitation    ??? Parkinson disease (HCC)    ??? Spinal stenosis    ??? Wears hearing aid in both ears        Surgical History:   Procedure Laterality Date   ??? LAPAROSCOPY  1973   ??? GASTRIC FUNDOPLICATION  2008 ??? DEEP BRAIN STIMULATOR PLACEMENT  2010    STN implants   ??? PR OPEN TX RIB FRACTURE W/INT FIX UNI 1-2 RIBS Left Feb 2017   ??? REMOVAL OF CURRENT SINGLE CHANNEL DEEP BRAIN STIMULATION INTERMITTENT PULSE GENERATORS, IMPLANTATION OF NEW SINGLE CHANNEL DEEP BRAIN STIMULATION INTERMITTENT PULSE GENERATORS, RIGHT CHEST AND LEFT CHEST Bilateral 10/03/2018    Performed by Alvino Blood, MD at [facility]   ??? INTERTROCHANTERIC HIP FRACTURE SURGERY  Jule 12 th, 2014    right hip   ??? SURGERY      Battery replacement in nerve stimulator       Does patient have diagnosis of dementia?: No, but has congitive impairment  Mental status/MOCA (date):   [facility] Neuro MOCA Review 03/03/2019 09/10/2017 07/17/2016   MOCA Score (out of 30) 21 25 29        Pregnancy Status  Pregnancy status was assessed and determined to be: Female: education  not applicable.    Movement Exam(s)/Test(s), recent:  Date - Exam/Test - Score/Assessment  --      [facility] Monitoring:  Parkinson Disease with Bilateral STN Implants - Level of disease activity / description of disease: Symptoms began in 2001, initial symptoms was right hand tremor. Diagnosed with Parkinson's disease in 2001. Atypical PD Features: None  Patient has difficulty tolerating Sinemet (carbidopa/levodopa) and dopamine agonists.  --Underwent bilateral Subthalamic stimulator implants in 2010 for bothersome tremor with marked improvement.     Previously trialed agents/medications (Allergy and/or intolerance) for this indication:   -Sinemet (nausea and fogginess)  -Pramipexole (side effects) - 01/13/2018  -Rytary (side effects)    Current medications being used for this indication:   - No - has DBS therapy    Hallucination - Level of disease activity / description of disease: Bothersome hallucinations     Previously trialed agents/medications (Allergy and/or intolerance) for this indication:   -No    Current medications being used for this indication: - Pimavanserin (NUPLAZID); to [name] July 2020     _     _     _     _     _     _     _     _     _     _     _     _     _     _     _    _     _     _     _     _     _     _     _        Safety Monitoring for the following Specialty Medications:  Pimavanserin (NUPLAZID);     Baseline Characteristics (03/24/2019) Monitoring (as indicated)   Baseline ECG:   ECG on 07/03/2018  with QTc interval of 440 msec.  (on Paxil 20mg  and Ditropan XL 10mg ) Hx Heart murmur and palpitation.  Periodic ECG (QTc interval) monitoring: 03/14/2019  Routine ECG for QTc interval monitoring Not required. Monitor as clinically indicated. - educated they can follow up with Primary care provider if they would like heart EKG monitoring after starting Nuplazid   Hx Chronic Kidney Disease?: no    Hx Liver disease?: no    Hx Asthma, COPD or other chronic respiratory condition: No    Hx Glaucoma (monitor IOPs): no    Hallucinations or Psychosis/Psychotic disorder: Formed VH    Hx Impulse Control Disorder(s) (ICD)?: no    Suicidal ideation (SI) or worsening mood?: no    Orthostasis?:  no    Additional risk factors: no    Hx=History of    Risk Evaluation and Mitigation Strategy (REMS) Assessment  No REMS program is required for this/these medication(s).   Contraindications  Alexis Matthews has no contraindications to this/these medication(s).  Safety Precautions  Safety precautions were evaluated and will be discussed with patient as applicable.  Vaccination Status Assessment     There is no immunization history on file for this patient.  Vaccine history was reviewed with the patient. The patient was reminded about the importance of receiving an annual influenza vaccine as indicated.     = = = = = = = = = = = = = = = = = = = = = = = = = = = = = = = = = = = = = = = = = = = = = = = = = = = =

## 2019-05-10 ENCOUNTER — Encounter: Admit: 2019-05-10 | Discharge: 2019-05-10

## 2019-05-10 DIAGNOSIS — F3289 Other specified depressive episodes: Secondary | ICD-10-CM

## 2019-05-11 MED ORDER — DULOXETINE 30 MG PO CPDR
ORAL_CAPSULE | Freq: Every day | ORAL | 0 refills | 60.00000 days | Status: DC
Start: 2019-05-11 — End: 2019-05-13

## 2019-05-11 NOTE — Telephone Encounter
[facility] pharmacy requesting #90 day refills. Last seen 04/15/2019. CYMBALTA included in plan of care. Rx refill sent.

## 2019-05-13 ENCOUNTER — Encounter: Admit: 2019-05-13 | Discharge: 2019-05-13

## 2019-05-13 ENCOUNTER — Ambulatory Visit: Admit: 2019-05-13 | Discharge: 2019-05-14

## 2019-05-13 ENCOUNTER — Ambulatory Visit: Admit: 2019-05-13 | Discharge: 2019-05-13

## 2019-05-13 DIAGNOSIS — F419 Anxiety disorder, unspecified: Secondary | ICD-10-CM

## 2019-05-13 DIAGNOSIS — R2689 Other abnormalities of gait and mobility: Secondary | ICD-10-CM

## 2019-05-13 DIAGNOSIS — K219 Gastro-esophageal reflux disease without esophagitis: Secondary | ICD-10-CM

## 2019-05-13 DIAGNOSIS — G20 Parkinson's disease: Secondary | ICD-10-CM

## 2019-05-13 DIAGNOSIS — I10 Essential (primary) hypertension: Secondary | ICD-10-CM

## 2019-05-13 DIAGNOSIS — S72009A Fracture of unspecified part of neck of unspecified femur, initial encounter for closed fracture: Secondary | ICD-10-CM

## 2019-05-13 DIAGNOSIS — R002 Palpitations: Secondary | ICD-10-CM

## 2019-05-13 DIAGNOSIS — T50905A Adverse effect of unspecified drugs, medicaments and biological substances, initial encounter: Secondary | ICD-10-CM

## 2019-05-13 DIAGNOSIS — M4800 Spinal stenosis, site unspecified: Secondary | ICD-10-CM

## 2019-05-13 DIAGNOSIS — R011 Cardiac murmur, unspecified: Secondary | ICD-10-CM

## 2019-05-13 DIAGNOSIS — Z9181 History of falling: Secondary | ICD-10-CM

## 2019-05-13 DIAGNOSIS — Z974 Presence of external hearing-aid: Secondary | ICD-10-CM

## 2019-05-13 DIAGNOSIS — E049 Nontoxic goiter, unspecified: Secondary | ICD-10-CM

## 2019-05-13 DIAGNOSIS — R443 Hallucinations, unspecified: Secondary | ICD-10-CM

## 2019-05-13 DIAGNOSIS — L84 Corns and callosities: Secondary | ICD-10-CM

## 2019-05-13 DIAGNOSIS — G903 Multi-system degeneration of the autonomic nervous system: Secondary | ICD-10-CM

## 2019-05-13 MED ORDER — QUETIAPINE 25 MG PO TAB
25 mg | ORAL_TABLET | Freq: Every evening | ORAL | 5 refills | Status: DC
Start: 2019-05-13 — End: 2019-06-04

## 2019-05-13 MED ORDER — DULOXETINE 60 MG PO CPDR
60 mg | ORAL_CAPSULE | Freq: Every day | ORAL | 5 refills | 60.00000 days | Status: DC
Start: 2019-05-13 — End: 2019-06-04

## 2019-05-13 NOTE — Progress Notes
Alexis Matthews is a 73 y.o. female.    Subjective:             History of Present Illness      Parkinson's Disease Follow-Up Visit    Since my last visit I am: Moderately worse    Symptoms Scale:    Memory problems: Mild, bothersome  Hallucinations/delusions: Mild, illusions  Depression: Moderate, bothersome  Anxiety: Slight, not bothersome  Apathy: Slight, some loss of interest  Impulsive behavior: None  Nighttime sleep: Mild, wake up to go to bathroom  Daytime sleepiness: Slight, occasionally  Vivid dreams: None  REM sleep behavior disorder: Mild, frequently talk but not disturbing  Restless leg syndrome: None  Pain or muscle cramps: Slight, pain or cramps occasionally  Urination: Mild, go frequently, bothersome  Constipation: Moderate, need OTC medication  Dizziness or lightheadedness: Slight, occasional on standing  Tiredness/Fatigue: Slight, reduced stamina  Falling: Moderate, multiple times a month  Personal care assistance: Moderate, I have some difficulty and need some help daily    Total Score:  Total Score: 28    Assistive Devices:  Assistive devices for getting around: Walker    OFF Time:    OFF time: No    Dyskinesia:    Dyskinesia while awake: No    Employment Status:    Employment: Retired - not due to PD          Review of Systems   HENT: Positive for trouble swallowing.    Gastrointestinal: Positive for diarrhea and nausea. Negative for abdominal distention, abdominal pain, anal bleeding, blood in stool, constipation, rectal pain and vomiting.   Genitourinary: Positive for enuresis. Negative for decreased urine volume, difficulty urinating, dyspareunia, dysuria, flank pain, frequency, genital sores, hematuria, menstrual problem, pelvic pain, urgency, vaginal bleeding, vaginal discharge and vaginal pain.   Neurological: Positive for speech difficulty.         Objective:         ??? cyanocobalamin (RUBRAMIN) 1,000 mcg/mL injection Inject 1 mL into the muscle every 30 days. ??? duloxetine DR (CYMBALTA) 30 mg capsule TAKE 1 CAPSULE BY MOUTH EVERY DAY   ??? famotidine (PEPCID) 20 mg tablet Take 20 mg by mouth at bedtime daily.   ??? levothyroxine (SYNTHROID) 100 mcg tablet Take 100 mcg by mouth daily 30 minutes before breakfast. Indications: BRAND NAME ONLY   ??? LORazepam (ATIVAN) 0.5 mg tablet Take 0.5 mg by mouth at bedtime daily.   ??? melatonin 3 mg tab Take 3 mg by mouth at bedtime daily.   ??? omeprazole DR(+) (PRILOSEC) 40 mg capsule Take 40 mg by mouth daily before breakfast.   ??? oxybutynin XL (DITROPAN XL) 10 mg tablet Take 10 mg by mouth daily.   ??? pimavanserin (NUPLAZID) 34 mg cap capsule Take one capsule by mouth daily.   ??? QUEtiapine (SEROQUEL) 25 mg tablet Take one tablet by mouth at bedtime daily.       Body mass index is 24.13 kg/m???.     Physical Exam    EXAMINATION:     ORIENTATION: Alert and Oriented.    Cranial Nerves: II-XII Unremarkable except reduced facial expression and soft voice.        Right Left   Bradkinesia  Mild Mild   Tremor Hands Resting None None    Postural None None    Kinetic None None           Tremor Other: None  Dyskinesia: None  Muscle Strength: Unremarkable  Gait: Needs assistance to walk  PDQ8 - Quality of Life  Had difficulty getting around in public places?: Sometimes   Had difficulty dressing?: Sometimes   Felt depressed?: Often   Had problems with your close personal relationships?: Occasionally     Had problems with your concentration, for example, when reading or watching TV?: Occasionally   Felt unable to communicate effectively?: Occasionally   Had painful muscle cramps or spasms?: Sometimes   Felt embarrassed in public due to having Parkinson's disease?: Ocassionally              PDQ8 Total Score (32 Possible): 13  PDQ8 Total %: 41                                                    Assessment and Plan:    Problem   Parkinson's disease with Bilateral STN Implants    Symptoms began in 2001, initial symptoms was right hand tremor. Diagnosed with Parkinson's disease in 2001. Atypical PD Features: None  Patient has difficulty tolerating Sinemet (carbidopa/levodopa) and dopamine agonists.   09/29/08 UPDRS Motor OFF 36  Underwent bilateral Subthalamic stimulator implants in 2010 for bothersome tremor with marked improvement.   03/24/2012 Mentation Score: 2 Activities of Daily Living Score: 4 Motor Exam:19 Total UPDRS Score: 25    PDQ 39 IMPACT PDQ Total Percent: 6.41 %  04/30/2013  Mentation Score: 3 Activities of Daily Living Score: 13 Motor Exam: 28 Total UPDRS Score: 44   PDQ 39 IMPACT PDQ Total Percent: 5.77 %   05/06/2014 Total Mentation Score: 5 Total Activities of Daily Living Score: 7 Total Motor Exam: 18 Total UPDRS Score: 30  PDQ Total Percent: 17.95 %  05/27/2015 Total Mentation Score: 4 Total Activities of Daily Living Score: 16 Total Motor Exam: 34 Total UPDRS Score: 54   PDQ Total Percent: 33.33 %   Patient was evaluated by Occupational Therapist, Kelli Reiling.    12/07/2015 PDQ8 Total %: 12   07/17/2016 Total Mentation Score: 4 Total Activities of Daily Living Score: 13 Total Motor Exam: 33 Total UPDRS Score: 50   PDQ Total Percent: 33.33 %   02/12/2017 PDQ8 Total %: 19   Nausea and fogginess with Sinemet (carbidopa/levodopa) 25/100   09/10/2017 PDQ Total Percent: 35.26 %   Total Mentation Score: 4 Total Activities of Daily Living Score: 15 Total Motor Exam: 29 Total UPDRS Score: 48   01/13/2018 Side effects to Mirapex (pramipexole)   06/11/2018 PDQ8 Total %: 34   Rytary (carbidopa/levodopa) extended release capsules causing side effects  03/03/2019 PDQ Total Percent: 33.97 %   Total Mentation Score: 5 Total Activities of Daily Living Score: 21 Total Motor Exam: 32 Total UPDRS Score: 58   04/15/2019 PDQ8 Total %: 44   05/13/2019 PDQ8 Total %: 41      Hallucination    04/15/2019 On Nuplazid (pimavanserin): No benefit     At Risk for Falls    09/08/2015 Fall Risk assessment Score 11  01/13/2018 Falls Risk Score 10 06/11/2018 Falls Risk Score:   9 Timed Up and Go with Pushing: 19 Seconds(w/ walker)     03/03/2019 Falls Risk Score:   12 Timed Up and Go with Pushing: 21 Seconds(walker)     04/15/2019 Falls Risk Score:   13 Timed Up and Go with Pushing: 26 Seconds(walker)        Depression  05/06/2014 Geriatric Depression Scale: 13  12/06/2014 On Paxil (paroxetine)   05/27/2015 Wellbutrin (bupropion) caused nausea  05/27/2015 Geriatric Depression Scale: 12 Does not like Paxil (paroxetine)   07/17/2016 Geriatric Depression Scale: 9   09/10/2017 Geriatric Depression Scale: 16   03/03/2019 Geriatric Depression Scale: 12      Neurogenic orthostatic hypotension (HCC)    05/06/2014 On Florinef (fludrocortisone)    Has tried Proamatine (midodrine)   02/12/2017 Off Florinef (fludrocortisone)           Parkinson's disease with Bilateral STN Implants  Her symptoms are stable. No medication changes were recommended during the current  visit. Patient was referred to the programming nurse to have IPG checked and reprogrammed.     Neurogenic orthostatic hypotension (HCC)  She has orthostatic hypotension secondary to parkinsonism. At the present time I recommended increased salt and fluid intake.      At risk for falls  Fall precautions discussed.    Depression  I recommended increasing Cymbalta (duloxetine) 60 mg daily.     Hallucination  I recommended discontinuing Nuplazid (pimavanserin). Patient was started on Seroquel (quetiapine) 25 mg at bedtime for hallucinations. Side effects of the medication were discussed with the patient and they were given a list of common side effects. Patient will call us 2-3 days for medication adjustment.      Patient will follow up in approximately 1 months.

## 2019-05-13 NOTE — Patient Instructions
1. Discontinuing Nuplazid (pimavanserin)     2. Increase Cymbalta (duloxetine) 60 mg daily    3. You were prescribed  Seroquel (quetiapine) 25 mg for hallucinations. Although Seroquel (quetiapine) is not approved for dementia related hallucinations, it is a common practice to use this medication.     Initial Treatment:  You will start  Seroquel (quetiapine) 25 mg at bedtime with or without food, please call the office after 2-3 days to increase the dose.     Common Side Effects  As with all medications, there are potential side effects that may occur. Common side effects include sleepiness, light headedness, blackout spell, constipation, weight gain, vomiting, headache, agitation, and drooling.     Elderly patients with dementia are at an increased risk of death.     Please call [phone] if you have any questions or side effects.

## 2019-05-13 NOTE — Assessment & Plan Note
I recommended increasing Cymbalta (duloxetine) 60 mg daily.

## 2019-05-13 NOTE — Assessment & Plan Note
She has orthostatic hypotension secondary to parkinsonism. At the present time I recommended increased salt and fluid intake.

## 2019-05-13 NOTE — Assessment & Plan Note
Fall precautions discussed.

## 2019-05-13 NOTE — Assessment & Plan Note
I recommended discontinuing Nuplazid (pimavanserin). Patient was started on Seroquel (quetiapine) 25 mg at bedtime for hallucinations. Side effects of the medication were discussed with the patient and they were given a list of common side effects. Patient will call us 2-3 days for medication adjustment.

## 2019-05-13 NOTE — Assessment & Plan Note
Her symptoms are stable. No medication changes were recommended during the current  visit. Patient was referred to the programming nurse to have IPG checked and reprogrammed.

## 2019-05-14 DIAGNOSIS — F3289 Other specified depressive episodes: Secondary | ICD-10-CM

## 2019-05-14 DIAGNOSIS — Z9181 History of falling: Secondary | ICD-10-CM

## 2019-05-14 DIAGNOSIS — G903 Multi-system degeneration of the autonomic nervous system: Secondary | ICD-10-CM

## 2019-05-15 ENCOUNTER — Encounter: Admit: 2019-05-15 | Discharge: 2019-05-15

## 2019-05-15 MED ORDER — NUPLAZID 34 MG PO CAP
34 mg | ORAL_CAPSULE | Freq: Every day | ORAL | 11 refills
Start: 2019-05-15 — End: ?

## 2019-05-18 ENCOUNTER — Encounter: Admit: 2019-05-18 | Discharge: 2019-05-18

## 2019-05-18 NOTE — Progress Notes
Parkinson's Disease . Their arrival mode was walker and they were accompanied by: husband, Ed.    Location of Lead Implant: Bilateral Subthalamic Nucleus (STN)   Type of internally implanted generator:Medtronic SC    DEVICE INFORMATION:  RIGHT BRAIN/ LEFT BODY LEFT BRAIN/ RIGHT BODY   Battery Voltage: 2.97  System Continuity Checked: Yes  Identified Problems: None  Battery Status: OK  Referred for Surgical Replacement: No Battery Voltage: 2.96  System Continuity Checked: Yes  Identified Problems: None  Battery Status: OK  Referred for Surgical Replacement: No     ------------------------------------------------------------------------------------------------------------------------------    PROGRESS NOTE:  Patient is here for 4 week f/u but 6 weeks after starting Nuplazid.  Patient reports moderate worsening of her parkinsons' symptoms over the past 4 weeks. Hallucinations are no better and now patient is c/o nausea.  She also has started new acid medications due to the zantac being taken off the market and has also had episodes of nausea in the past. Reports no off time and no dyskinesias today.  No changes made to stimulation parameters.  Seen by Dr Erline Levine who suggested seroquel 25 mgs at hs and increasing her cymbalta to 60 mg daily.  No other changes made to patients medication schedule. f/u appointments made and provided for 1 month per Dr Janell Quiet' request.      ------------------------------------------------------------------------------------------------------------------------------    RIGHT BRAIN/ LEFT BODY LEFT BRAIN/ RIGHT BODY   PRE-VISIT SETTINGS (ACTIVE GROUP) PRE-VISIT SETTINGS  (ACTIVE GROUP)   Amplitude: 2.9   Pulse Width: 60 micro secs  Rate: 145 pps   Load Impedance: - ohms   Contacts: 1-  Case + Amplitude: 2.8   Pulse Width: 60 micro secs  Rate: 145 pps   Load Impedance: - ohms   Contacts: 1-  Case +       RIGHT BRAIN/ LEFT BODY LEFT BRAIN/ RIGHT BODY POST-VISIT SETTINGS (ACTIVE GROUP) POST-VISIT SETTINGS (ACTIVE GROUP)   Amplitude: 2.9   Pulse Width: 60 micro secs  Rate: 145 pps   Load Impedance: 934 ohms   Contacts: 1-  Case + Amplitude: 2.8   Pulse Width: 60 micro secs  Rate: 145 pps   Load Impedance: 821 ohms   Contacts: 1-  Case +     Patient Programmer Limits For: Amplitude +0.8, -0.6    DBS analysis without programming: Yes    DBS Programming Time  Start: n/a Stop: n/a    ------------------------------------------------------------------------------------------------------------------------------    Patient Caregiver Education:     ??? Department Contacts/phone numbers: Yes  ??? Safety for diathermy: No  ??? Patient resources: Yes  ??? Diagnostics: Yes  ??? Medical alert identification: No  ??? Exercise: Yes  ??? Hardware maintenance/problems: Yes  ??? Dental care: No  ??? Diet: Yes    Patient programmer education:    ??? On/off status: Yes  ??? Self Adjustments: Yes  ??? Battery status: Yes       Educational Goals/Outcomes; Patient/Family will verbalize understanding to the following:           Yes/No          Comments   Disease Process     Yes    Diagnostic Procedures     Yes      Treatment Plan     Yes    Medications     Yes    Nutrition/Diet     Yes    Personal Hygiene     Yes    Side Effect Management     Yes  Medical Equipment     Yes    Safety     Yes    Discharge Instructions     Yes    Other

## 2019-05-20 ENCOUNTER — Encounter: Admit: 2019-05-20 | Discharge: 2019-05-20

## 2019-05-20 NOTE — Progress Notes
[facility] Removal    KIBIBI EISEN  is being removed from the [facility] specialty pharmacy patient management program due to patient stopping the medication  Pimavanserin (NUPLAZID);  for Lack of benefit.    The medication had previously been removed from the patient's active medication list.  Should the patient's care team wish to restart the medication, a new prescription will need to be sent to [facility] [place]. Should the patient be able to/choose to fill specialty medications at [facility] in the future, these services will be re-initiated (unless the patient decides to opt-out of the program).     Patient/Caregiver is aware and patient is no longer taking the medication. The patient's questions and concerns were addressed as needed. The patient was instructed to contact their health care provider if their symptoms or health problems require medical attention.    The patient's provider is aware that they are no longer taking the medication.    Clinical pharmacist services are still available this patient of the [facility] [place].       Marti Sleigh, PharmD, [place]  Clinical Pharmacist  Office: [phone]    [facility] [place] [facility] [place]

## 2019-05-25 ENCOUNTER — Encounter: Admit: 2019-05-25 | Discharge: 2019-05-25

## 2019-06-02 ENCOUNTER — Encounter: Admit: 2019-06-02 | Discharge: 2019-06-02

## 2019-06-02 NOTE — Telephone Encounter
Ok to retry Nuplazid (pimavanserin), stay on Seroquel (quetiapine)  For now

## 2019-06-02 NOTE — Telephone Encounter
Patients husband called saying the Nuplazid helped her more than the 25 mgs of seroquel. He said the hallucinations are getting much worse. He wants to know if he can start giving her that again.  Her stomach issues are acid and they are related to her stopping the zantac.  Is this ok or would you  Prefer to increase the seroquel?Marland Kitchen

## 2019-06-03 ENCOUNTER — Encounter: Admit: 2019-06-03 | Discharge: 2019-06-03

## 2019-06-03 MED ORDER — NUPLAZID 34 MG PO CAP
34 mg | ORAL_CAPSULE | Freq: Every day | ORAL | 5 refills | Status: DC
Start: 2019-06-03 — End: 2019-06-29
  Filled 2019-06-11: qty 30, 30d supply, fill #1

## 2019-06-03 NOTE — Telephone Encounter
Patients husband called.  Explained what you suggested.  Ed verbalized understanding. Will notify [facility] that patient will restart Nuplazid 34 mg daily.

## 2019-06-04 ENCOUNTER — Encounter: Admit: 2019-06-04 | Discharge: 2019-06-04

## 2019-06-04 DIAGNOSIS — R443 Hallucinations, unspecified: Secondary | ICD-10-CM

## 2019-06-04 DIAGNOSIS — F3289 Other specified depressive episodes: Secondary | ICD-10-CM

## 2019-06-04 MED ORDER — QUETIAPINE 25 MG PO TAB
ORAL_TABLET | Freq: Every day | 3 refills | Status: DC
Start: 2019-06-04 — End: 2019-06-29

## 2019-06-04 MED ORDER — DULOXETINE 60 MG PO CPDR
ORAL_CAPSULE | Freq: Every day | ORAL | 3 refills | 60.00000 days | Status: AC
Start: 2019-06-04 — End: ?

## 2019-06-04 NOTE — Telephone Encounter
Request for 90 day supply received from [facility] pharmacy. Medication sent with 90 day supply.

## 2019-06-10 ENCOUNTER — Encounter: Admit: 2019-06-10 | Discharge: 2019-06-10 | Payer: MEDICARE

## 2019-06-11 ENCOUNTER — Encounter: Admit: 2019-06-11 | Discharge: 2019-06-11 | Payer: MEDICARE

## 2019-06-17 ENCOUNTER — Encounter: Admit: 2019-06-17 | Discharge: 2019-06-17 | Payer: MEDICARE

## 2019-06-19 ENCOUNTER — Encounter: Admit: 2019-06-19 | Discharge: 2019-06-19 | Payer: MEDICARE

## 2019-06-19 NOTE — Telephone Encounter
Patient husband called asking if he can wait one more week to come in for f/u after staying on the Nuplazid and starting seroquel.  He says they started in the past two days taking both medication (Nuplazid and Quetiapine at bedtime and Millana is tolerating that much better.  Her hallucinations have also improved.  We did get a cancellation on Oct 5th so I can put her there.  Ok?

## 2019-06-29 MED ORDER — NUPLAZID 34 MG PO CAP
34 mg | Freq: Every day | ORAL | 0 refills | Status: DC
Start: 2019-06-29 — End: 2019-07-15

## 2019-06-29 MED ORDER — QUETIAPINE 25 MG PO TAB
50 mg | ORAL_TABLET | Freq: Every evening | ORAL | 3 refills | Status: DC
Start: 2019-06-29 — End: 2020-01-05

## 2019-06-29 NOTE — Assessment & Plan Note
Her symptoms are stable. No medication changes were recommended during the current  visit.

## 2019-06-29 NOTE — Progress Notes
Alexis Matthews is a 73 y.o. female.    Subjective:             History of Present Illness      Parkinson's Disease Follow-Up Visit    Since my last visit I am: Slightly better    Symptoms Scale:    Memory problems: Mild, bothersome  Hallucinations/delusions: Slight, sense of presence  Depression: Slight, not bothersome  Anxiety: Mild, can be bothersome  Apathy: Slight, some loss of interest  Impulsive behavior: None  Nighttime sleep: Mild, wake up to go to bathroom  Daytime sleepiness: Slight, occasionally  Vivid dreams: Slight, occasional  REM sleep behavior disorder: Slight, rarely  Restless leg syndrome: None  Pain or muscle cramps: Severe, cannot do daily activities  Urination: Mild, go frequently, bothersome  Constipation: Moderate, need OTC medication  Dizziness or lightheadedness: Slight, occasional on standing  Tiredness/Fatigue: Marked, cannot do majority of activities  Falling: Marked, more than once a week  Personal care assistance: Moderate, I have some difficulty and need some help daily    Total Score:  Total Score: 34    Assistive Devices:  Assistive devices for getting around: Walker    OFF Time:    OFF time: No    Dyskinesia:    Dyskinesia while awake: No    Employment Status:    Employment: Retired - not due to PD          Review of Systems   HENT: Positive for trouble swallowing.    Gastrointestinal: Negative.    Genitourinary: Positive for enuresis and urgency. Negative for decreased urine volume, difficulty urinating, dyspareunia, dysuria, flank pain, frequency, genital sores, hematuria, menstrual problem, pelvic pain, vaginal bleeding, vaginal discharge and vaginal pain.   Neurological: Positive for speech difficulty.         Objective:         ? cyanocobalamin (RUBRAMIN) 1,000 mcg/mL injection Inject 1 mL into the muscle every 30 days.   ? duloxetine DR (CYMBALTA) 60 mg capsule TAKE 1 CAPSULE BY MOUTH EVERY DAY   ? famotidine (PEPCID) 20 mg tablet Take 20 mg by mouth at bedtime daily. ? levothyroxine (SYNTHROID) 100 mcg tablet Take 100 mcg by mouth daily 30 minutes before breakfast. Indications: BRAND NAME ONLY   ? LORazepam (ATIVAN) 0.5 mg tablet Take 0.5 mg by mouth at bedtime daily.   ? melatonin 3 mg tab Take 3 mg by mouth at bedtime daily. Takes 5 mg at hs.   ? omeprazole DR(+) (PRILOSEC) 40 mg capsule Take 40 mg by mouth daily before breakfast.   ? oxybutynin XL (DITROPAN XL) 10 mg tablet Take 10 mg by mouth daily.   ? pimavanserin (NUPLAZID) 34 mg cap capsule Take one capsule by mouth daily.   ? QUEtiapine (SEROQUEL) 25 mg tablet Take two tablets by mouth at bedtime daily.       Body mass index is 22.62 kg/m?Marland Kitchen     Physical Exam    EXAMINATION:     ORIENTATION: Alert and Oriented.    Cranial Nerves: II-XII Unremarkable except reduced facial expression and soft voice.        Right Left   Bradkinesia  Mild Mild   Tremor Hands Resting None None    Postural None None    Kinetic None None           Tremor Other: None  Dyskinesia: None  Muscle Strength: Unremarkable  Gait: Needs assistance to walk  PDQ8 - Quality of Life  Had difficulty getting around in public places?: Ocassionally   Had difficulty dressing?: Occasionally   Felt depressed?: Sometimes   Had problems with your close personal relationships?: Occasionally     Had problems with your concentration, for example, when reading or watching TV?: Sometimes   Felt unable to communicate effectively?: Sometimes   Had painful muscle cramps or spasms?: Often   Felt embarrassed in public due to having Parkinson's disease?: Ocassionally              PDQ8 Total Score (32 Possible): 13  PDQ8 Total %: 41                  Timed Up and Go with Pushing: 17 Seconds(walker)                                 Assessment and Plan:    Problem   Parkinson's disease with Bilateral STN Implants    Symptoms began in 2001, initial symptoms was right hand tremor. Diagnosed with Parkinson's disease in 2001. Atypical PD Features: None Patient has difficulty tolerating Sinemet (carbidopa/levodopa) and dopamine agonists.   09/29/08 UPDRS Motor OFF 36  Underwent bilateral Subthalamic stimulator implants in 2010 for bothersome tremor with marked improvement.   03/24/2012 Mentation Score: 2 Activities of Daily Living Score: 4 Motor Exam:19 Total UPDRS Score: 25    PDQ 39 IMPACT PDQ Total Percent: 6.41 %  04/30/2013  Mentation Score: 3 Activities of Daily Living Score: 13 Motor Exam: 28 Total UPDRS Score: 44   PDQ 39 IMPACT PDQ Total Percent: 5.77 %   05/06/2014 Total Mentation Score: 5 Total Activities of Daily Living Score: 7 Total Motor Exam: 18 Total UPDRS Score: 30  PDQ Total Percent: 17.95 %  05/27/2015 Total Mentation Score: 4 Total Activities of Daily Living Score: 16 Total Motor Exam: 34 Total UPDRS Score: 54   PDQ Total Percent: 33.33 %   Patient was evaluated by Occupational Therapist, Kelli Reiling.    07/17/2016 Total Mentation Score: 4 Total Activities of Daily Living Score: 13 Total Motor Exam: 33 Total UPDRS Score: 50   PDQ Total Percent: 33.33 %   02/12/2017 PDQ8 Total %: 19   Nausea and fogginess with Sinemet (carbidopa/levodopa) 25/100   09/10/2017 PDQ Total Percent: 35.26 %   Total Mentation Score: 4 Total Activities of Daily Living Score: 15 Total Motor Exam: 29 Total UPDRS Score: 48   01/13/2018 Side effects to Mirapex (pramipexole)   06/11/2018 PDQ8 Total %: 34   Rytary (carbidopa/levodopa) extended release capsules causing side effects  03/03/2019 PDQ Total Percent: 33.97 %   Total Mentation Score: 5 Total Activities of Daily Living Score: 21 Total Motor Exam: 32 Total UPDRS Score: 58   04/15/2019 PDQ8 Total %: 44   05/13/2019 PDQ8 Total %: 41   06/29/2019 PDQ8 Total %: 41      Hallucination    04/15/2019 On Nuplazid (pimavanserin): worse when discontinued  06/29/2019 On Seroquel (quetiapine)       At Risk for Falls    09/08/2015 Fall Risk assessment Score 11  01/13/2018 Falls Risk Score 10 06/11/2018 Falls Risk Score:   9 Timed Up and Go with Pushing: 19 Seconds(w/ walker)     03/03/2019 Falls Risk Score:   12 Timed Up and Go with Pushing: 21 Seconds(walker)     04/15/2019 Falls Risk Score:   13 Timed Up and Go with  Pushing: 26 Seconds(walker)     06/29/2019 Timed Up and Go with Pushing: 17 Seconds(walker)      Depression    05/06/2014 Geriatric Depression Scale: 13  12/06/2014 On Paxil (paroxetine)   05/27/2015 Wellbutrin (bupropion) caused nausea  05/27/2015 Geriatric Depression Scale: 12 Does not like Paxil (paroxetine)   07/17/2016 Geriatric Depression Scale: 9   09/10/2017 Geriatric Depression Scale: 16   03/03/2019 Geriatric Depression Scale: 12          Parkinson's disease with Bilateral STN Implants  Her symptoms are stable. No medication changes were recommended during the current  visit. IPG settings were not changed.      Depression  Her symptoms are stable. No medication changes were recommended during the current  visit.      Hallucination  I recommended increasing Seroquel (quetiapine) 50 mg at bedtime.    Patient will follow up in approximately 6 months.

## 2019-06-29 NOTE — Assessment & Plan Note
I recommended increasing Seroquel (quetiapine) 50 mg at bedtime.

## 2019-06-29 NOTE — Assessment & Plan Note
Her symptoms are stable. No medication changes were recommended during the current  visit. IPG settings were not changed.

## 2019-06-29 NOTE — Progress Notes
Clinical Pharmacist Note - [facility]    Reason(s) for visit:    - Medication questions/issues    Date of Service: 06/29/2019    Visit Type: Chart review    Per prior encounter on 06/02/2019, family of patient wanted to restart Nuplazid as after stopping it they thought it was more helpful that quetiapine alone.    Neurologist restarted Nuplazid.    Will reach out for Nuplazid reassessment in ~6-8 weeks.      Follow Up:  - The patient will be contacted to complete another reassessment for Pimavanserin (NUPLAZID);  within 1 year; and as needed    -With PCP and specialists as instructed and as needed    Patient has my contact information and instructed to contact clinic for any additional questions or concerns.       Thank you for the opportunity to care for this patient.    Marti Sleigh, PharmD, [place]  Clinical Pharmacist  Office: [phone]    [facility] [place] [facility] [place]

## 2019-07-15 ENCOUNTER — Encounter: Admit: 2019-07-15 | Discharge: 2019-07-15 | Payer: MEDICARE

## 2019-07-15 MED ORDER — NUPLAZID 34 MG PO CAP
34 mg | ORAL_CAPSULE | Freq: Every day | ORAL | 5 refills | Status: DC
Start: 2019-07-15 — End: 2020-01-04
  Filled 2019-08-13: qty 30, 30d supply, fill #2

## 2019-07-15 MED ORDER — NUPLAZID 34 MG PO CAP
34 mg | ORAL_CAPSULE | Freq: Every day | ORAL | 5 refills | Status: CN
Start: 2019-07-15 — End: ?

## 2019-07-15 NOTE — Telephone Encounter
Received refill request from [facility]. Nuplazid included in plan of care outlined by Luan Pulling, MD. Escribed to pharmacy. Patient has appropriate follow up scheduled.

## 2019-07-27 ENCOUNTER — Encounter: Admit: 2019-07-27 | Discharge: 2019-07-27 | Payer: MEDICARE

## 2019-07-27 NOTE — Progress Notes
Clinical Pharmacist Note - [facility]    Reason(s) for visit:    - Pharmacy medication Reassessment: Pimavanserin (NUPLAZID);     Date of Service: 07/27/2019    Visit Type: Telephone   Accompanied by: spouse      Assessment and Plan:    1. Parkinson Disease  :     A reassessment has been completed for Nuplazid. Plan to continue therapy, no changes.  Since restart of Nuplazid with quetiapine, patient's formed visual hallucinations have lessened now to every other day and non threatening. Will likely take another ~3 weeks to see full effect of Nuplazid restart.    When they restarted Nuplazid 34mg , they reduced quetiapine from 50mg  to 25mg .      They are to contact clinic with any questions or concerns      Follow Up:  - The patient will be contacted to complete another reassessment for Pimavanserin (NUPLAZID);  within 1 year; and as needed    -With PCP and specialists as instructed and as needed    Patient has my contact information and instructed to contact clinic for any additional questions or concerns.       Thank you for the opportunity to care for this patient.    Please note that documentation of records were done during a busy neurological clinic. Attempts have been made to Matthews the document for any errors. Please excuse for brevity and typographical errors.    Arbie Cookey, PharmD, BCACP  Clinical Pharmacist  Office: [phone]    [facility]  [facility]      Pharmacy Medication Reassessment: Second Generation (Atypical) Antipsychotic  Pimavanserin (NUPLAZID)      Alexis Matthews is a 73 y.o. female .       Nuplazid was restarted mid Sept 2020. As per prior encounter on 06/02/2019, family of patient wanted to restart Nuplazid as after stopping it they thought it was more helpful that quetiapine alone.    Has been on both Seroquel and Nuplazid for last 3 weeks and much better. Once every other day patient does not mention it as often. Currently nonthreatening hallucinations. Better at accepting that .  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    The regimen of 34 mg by mouth once daily is planned to continue indefinitely which is appropriate for Alexis Matthews. No renal or hepatic adjustments are required.      At this time the there is no planned dose titration.     Hallucinations/PD Psychosis reassessment (Patient or Caregiver/proxy)  -Over the past week have you seen, heard, smelled or felt things that were not really there?: 3: Moderate: Formed hallucinations with loss of insight.    -How frequently do you see or hear things (visual and/or auditory hallucinations) that are not there?: 3.Frequently (more than 3 times per week, 50% of days)    -Can you tell that these things (hallucinations, delusions) are not real [ask about whatever is more prominent]?: 2. After talking to other people who say they are not real, I am convinced they are definitely Not real.    -When you have the hallucinations, do the hallucinations threaten you in any way? This means the actions against you, not just the fact they are there:1. Never  Adherence  Refill and adherence history were reviewed with the patient. The patient is adherent with refills and is meeting their refill goal. They report no missed doses over the past 30 days.  The patient is meeting their adherence goal. The patient was reminded about the refill process and re-educated on the importance of adherence.    Adverse Effects  Alexis Matthews is not experiencing any significant adverse effects to this medication regimen.    Therapeutic Goal(s):  -Initial reduction in severity and/or frequency of hallucinations and/or delusions after at least 8 weeks of therapy: Continue therapy - Not at goal therapeutic effect due to pending time for onset       Baseline Characteristics (03/24/2019   Previously trialed agents for this indication: no   Current medications being used for this indication: no   Allergy and/or intolerance to previous medications for this indication: no   Level of disease activity / description of disease: Parkinson Disease with Bilateral STN Implants - Level of disease activity / description of disease: Symptoms began in 2001, initial symptoms was right hand tremor. Diagnosed with Parkinson's disease in 2001. Atypical PD Features:?None  Patient has difficulty tolerating Sinemet (carbidopa/levodopa) and dopamine agonists.  --Underwent bilateral Subthalamic stimulator implants in 2010 for bothersome tremor with marked improvement.   ?  Hallucination - Level of disease activity / description of disease: Bothersome formed visual hallucinations   ?   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 ?(on Paxil 20mg  and Ditropan XL 10mg ) Hx Heart murmur and palpitation.     Medical History:   Diagnosis Date   ? Acid reflux    ? Adverse drug reaction    ? Anxiety and depression    ? Balance problem 2016   ? Enlarged thyroid    ? Foot callus 2014    left   ? Heart murmur    ? Hip fracture (HCC)    ? Hypertension    ? Palpitation    ? Parkinson disease (HCC)    ? Spinal stenosis    ? Wears hearing aid in both ears        Pregnancy Status  Pregnancy status was assessed and determined to be: Female, not of child-bearing potential: education not applicable.     Labs:  Lab Results   Component Value Date/Time    NA 139 07/09/2018 12:30 PM    K 3.6 07/09/2018 12:30 PM    CL 106 07/09/2018 12:30 PM CO2 23 07/09/2018 12:30 PM    GAP 10 07/09/2018 12:30 PM    BUN 13 07/09/2018 12:30 PM    CR 0.98 07/09/2018 12:30 PM    GLU 100 07/09/2018 12:30 PM    CA 9.3 07/09/2018 12:30 PM    MG 2.2 03/09/2013 04:17 AM    ALBUMIN 5.1 (H) 03/04/2013 08:10 PM    TOTPROT 7.6 03/04/2013 08:10 PM    ALKPHOS 86 03/04/2013 08:10 PM    AST 17 03/04/2013 08:10 PM    ALT 11 03/04/2013 08:10 PM    TOTBILI 1.1 03/04/2013 08:10 PM    GFR 56 (L) 07/09/2018 12:30 PM    GFRAA >60 07/09/2018 12:30 PM         Allergies  Allergies   Allergen Reactions   ? Latex SWELLING   ? Primidone MENTAL STATUS CHANGES     Does not like the feeling it gives her feels like I'm outside my body.   ? Celexa [Citalopram] STOMACH UPSET   ? Flu Vaccine [Influenza Virus Vaccines] NAUSEA AND VOMITING   ? Lisinopril SEE COMMENTS     Hypotension and stomach cramps   ? Neurontin [Gabapentin] DIZZINESS     Falls          Vitals (recent):  There were no vitals filed for this visit.      Medication Reconciliation  A medication history and reconciliation were performed (including prescription medications, supplements, over the counter, and herbal products). The medication list was updated and the patients? current medication list is included below.     Home Medications    Medication Sig   cyanocobalamin (RUBRAMIN) 1,000 mcg/mL injection Inject 1 mL into the muscle every 30 days.   duloxetine DR (CYMBALTA) 60 mg capsule TAKE 1 CAPSULE BY MOUTH EVERY DAY   famotidine (PEPCID) 20 mg tablet Take 20 mg by mouth at bedtime daily.   levothyroxine (SYNTHROID) 100 mcg tablet Take 100 mcg by mouth daily 30 minutes before breakfast. Indications: BRAND NAME ONLY   LORazepam (ATIVAN) 0.5 mg tablet Take 0.5 mg by mouth at bedtime daily.   melatonin 3 mg tab Take 5 mg by mouth at bedtime daily. Takes 5 mg at hs.   omeprazole DR(+) (PRILOSEC) 40 mg capsule Take 40 mg by mouth daily before breakfast.   oxybutynin XL (DITROPAN XL) 10 mg tablet Take 10 mg by mouth daily. pimavanserin (NUPLAZID) 34 mg cap capsule Take one capsule by mouth daily.   QUEtiapine (SEROQUEL) 25 mg tablet Take two tablets by mouth at bedtime daily.  Patient taking differently: Take 25 mg by mouth at bedtime daily.        Drug-drug and drug-food interactions between the patients? specialty medication and their medication list were assessed and reviewed with the patient. The patient was instructed to speak with their health care provider before starting any new drugs, including prescription or over the counter, natural / herbal products, or vitamins.    No new significant drug-drug or drug-food interactions were identified.    Their regimen can be taken with or without food.    ECG (QTc interval) monitoring:   Routine ECG for QTc interval monitoring Not required. Monitor as clinically indicated.    Contraindications  Alexis Matthews has no contraindications to this medication.    Safety Precautions  Safety precautions were evaluated and will be discussed with patient as applicable.    Risk Evaluation and Mitigation Strategy (REMS) Assessment  No REMS program is required for this medication.     Vaccination Status Assessment     There is no immunization history on file for this patient.  Vaccine history was reviewed with the patient. The patient was reminded about the importance of receiving an annual influenza vaccine as indicated.     Logistics and Questions  Alexis Matthews (and/or caregiver where applicable) was given the opportunity to ask questions and did not have any questions at this time.

## 2019-08-13 ENCOUNTER — Encounter: Admit: 2019-08-13 | Discharge: 2019-08-13 | Payer: MEDICARE

## 2019-09-14 ENCOUNTER — Encounter: Admit: 2019-09-14 | Discharge: 2019-09-14 | Payer: MEDICARE

## 2019-09-14 MED FILL — NUPLAZID 34 MG PO CAP: 34 mg | ORAL | 30 days supply | Qty: 30 | Fill #3 | Status: AC

## 2019-10-05 ENCOUNTER — Encounter: Admit: 2019-10-05 | Discharge: 2019-10-05 | Payer: MEDICARE

## 2019-10-05 NOTE — Progress Notes
Copay assistance of $3200 was obtained for the specialty medication Nuplazid using grant from [employer]  and now the copay is $0.  MELORA PRAWDZIK has stated this copay is affordable.  The specialty pharmacy will pursue additional copay assistance as necessary.  The specialty pharmacy will reach out to the ambulatory clinical pharmacist if the copay becomes unaffordable.    The medication will be delivered to patient's prescription address per the patient's request.    [place] Patient Advocate  [phone]

## 2019-10-08 ENCOUNTER — Encounter: Admit: 2019-10-08 | Discharge: 2019-10-08 | Payer: MEDICARE

## 2019-10-09 ENCOUNTER — Encounter: Admit: 2019-10-09 | Discharge: 2019-10-09 | Payer: MEDICARE

## 2019-10-09 NOTE — Progress Notes
The Prior Authorization for Nuplazid  was submitted for Noel Gerold via Cover My Meds.  Will continue to follow.    [place] Patient Advocate  [phone]

## 2019-10-13 ENCOUNTER — Encounter: Admit: 2019-10-13 | Discharge: 2019-10-13 | Payer: MEDICARE

## 2019-10-13 MED FILL — NUPLAZID 34 MG PO CAP: 34 mg | ORAL | 30 days supply | Qty: 30 | Fill #4 | Status: AC

## 2019-10-13 NOTE — Progress Notes
The Prior Authorization for Nuplazid was approved for Alexis Matthews from 10-09-2019 to 09-23-2021.  The copay is $1127.00.  The [place] authorization number is [id number].    Copay assistance in the amount of unlimited until the end of the year was obtained for the patient using grant from [employer]  and now the copay is $0.00.  Alexis Matthews has stated this copay is affordable.  The specialty pharmacy will pursue additional copay assistance as necessary.  The specialty pharmacy will reach out to the ambulatory clinical pharmacist if the copay becomes unaffordable.    The medication will be delivered to patient's prescription address per the patient's request.    [place] Patient Advocate  [phone]

## 2019-11-05 ENCOUNTER — Encounter: Admit: 2019-11-05 | Discharge: 2019-11-05 | Payer: MEDICARE

## 2019-11-05 MED FILL — NUPLAZID 34 MG PO CAP: 34 mg | ORAL | 30 days supply | Qty: 30 | Fill #5 | Status: AC

## 2019-12-08 ENCOUNTER — Encounter: Admit: 2019-12-08 | Discharge: 2019-12-08 | Payer: MEDICARE

## 2019-12-08 MED FILL — NUPLAZID 34 MG PO CAP: 34 mg | ORAL | 30 days supply | Qty: 30 | Fill #6 | Status: AC

## 2020-01-01 ENCOUNTER — Encounter: Admit: 2020-01-01 | Discharge: 2020-01-01 | Payer: MEDICARE

## 2020-01-04 ENCOUNTER — Encounter: Admit: 2020-01-04 | Discharge: 2020-01-04 | Payer: MEDICARE

## 2020-01-04 MED ORDER — NUPLAZID 34 MG PO CAP
34 mg | ORAL_CAPSULE | Freq: Every day | ORAL | 5 refills | Status: AC
Start: 2020-01-04 — End: ?
  Filled 2020-01-06: qty 30, 30d supply, fill #1

## 2020-01-05 ENCOUNTER — Ambulatory Visit: Admit: 2020-01-05 | Discharge: 2020-01-05 | Payer: MEDICARE

## 2020-01-05 ENCOUNTER — Encounter: Admit: 2020-01-05 | Discharge: 2020-01-05 | Payer: MEDICARE

## 2020-01-05 MED ORDER — QUETIAPINE 25 MG PO TAB
25 mg | ORAL_TABLET | Freq: Every evening | ORAL | 3 refills | Status: AC
Start: 2020-01-05 — End: ?

## 2020-01-05 NOTE — Progress Notes
Alexis Matthews is a 74 y.o. female.    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  Restless leg syndrome: None  Pain or muscle cramps: Slight, pain or cramps occasionally  Urination: Severe, incontinence  Constipation: Marked, need prescription medication  Dizziness or lightheadedness: Mild, often on standing  Tiredness/Fatigue: Slight, reduced stamina  Falling: Moderate, multiple times a month  Personal care assistance: Moderate, I have some difficulty and need some help daily    Total Score:  Total Score: 32    Assistive Devices:  Assistive devices for getting around: Walker    OFF Time:    OFF time: No    Dyskinesia:    Dyskinesia while awake: No    Employment Status:    Employment: Retired - not due to PD          Review of Systems   HENT: Positive for trouble swallowing.    Gastrointestinal: Negative.    Genitourinary: Positive for enuresis and urgency. Negative for decreased urine volume, difficulty urinating, dyspareunia, dysuria, flank pain, frequency, genital sores, hematuria, menstrual problem, pelvic pain, vaginal bleeding, vaginal discharge and vaginal pain.   Neurological: Positive for speech difficulty.         Objective:         ? cyanocobalamin (RUBRAMIN) 1,000 mcg/mL injection Inject 1 mL into the muscle every 30 days.   ? duloxetine DR (CYMBALTA) 60 mg capsule TAKE 1 CAPSULE BY MOUTH EVERY DAY   ? famotidine (PEPCID) 20 mg tablet Take 20 mg by mouth at bedtime daily.   ? levothyroxine (SYNTHROID) 100 mcg tablet Take 100 mcg by mouth daily 30 minutes before breakfast. Indications: BRAND NAME ONLY   ? LORazepam (ATIVAN) 0.5 mg tablet Take 0.5 mg by mouth at bedtime daily.   ? melatonin 3 mg tab Take 5 mg by mouth at bedtime daily. Takes 5 mg at hs.   ? omeprazole DR(+) (PRILOSEC) 40 mg capsule Take 40 mg by mouth daily before breakfast.   ? oxybutynin XL (DITROPAN XL) 10 mg tablet Take 15 mg by mouth daily.   ? pimavanserin (NUPLAZID) 34 mg cap capsule Take one capsule by mouth daily.   ? QUEtiapine (SEROQUEL) 25 mg tablet Take two tablets by mouth at bedtime daily. (Patient taking differently: Take 25 mg by mouth at bedtime daily.)       Body mass index is 22.4 kg/m?Marland Kitchen     Physical Exam    EXAMINATION:     ORIENTATION: Alert and Oriented.    Cranial Nerves:  reduced facial expression and soft voice.        Right Left   Bradkinesia  Mild Mild   Tremor Hands Resting None None    Postural None None    Kinetic None None           Tremor Other: None  Dyskinesia: None  Muscle Strength: Unremarkable  Gait: Needs assistance to walk  PDQ8 - Quality of Life  Had difficulty getting around in public places?: Sometimes   Had difficulty dressing?: Occasionally   Felt depressed?: Sometimes   Had problems with your close personal relationships?: Occasionally     Had problems with your concentration, for example, when reading or watching TV?: Sometimes   Felt unable to communicate effectively?: Occasionally   Had painful muscle cramps or spasms?: Occasionally   Felt embarrassed in public due to having Parkinson's disease?: Ocassionally              PDQ8 Total Score (32 Possible): 11  PDQ8 Total %: 34                  Timed Up and Go with Pushing: 28 Seconds(UStep walker)            Falls Risk Score:   10  High risk of falls                          Assessment and Plan:    Problem   Parkinson's disease with Bilateral STN Implants    Symptoms began in 2001, initial symptoms was right hand tremor. Diagnosed with Parkinson's disease in 2001. Atypical PD Features: None  Patient has difficulty tolerating Sinemet (carbidopa/levodopa) and dopamine agonists.   09/29/08 UPDRS Motor OFF 36  Underwent bilateral Subthalamic stimulator implants in 2010 for bothersome tremor with marked improvement.   03/24/2012 Mentation Score: 2 Activities of Daily Living Score: 4 Motor Exam:19 Total UPDRS Score: 25    PDQ 39 IMPACT PDQ Total Percent: 6.41 %  04/30/2013  Mentation Score: 3 Activities of Daily Living Score: 13 Motor Exam: 28 Total UPDRS Score: 44   PDQ 39 IMPACT PDQ Total Percent: 5.77 %   05/06/2014 Total Mentation Score: 5 Total Activities of Daily Living Score: 7 Total Motor Exam: 18 Total UPDRS Score: 30  PDQ Total Percent: 17.95 %  05/27/2015 Total Mentation Score: 4 Total Activities of Daily Living Score: 16 Total Motor Exam: 34 Total UPDRS Score: 54   PDQ Total Percent: 33.33 %   Patient was evaluated by Occupational Therapist, Kelli Reiling.    07/17/2016 Total Mentation Score: 4 Total Activities of Daily Living Score: 13 Total Motor Exam: 33 Total UPDRS Score: 50   Nausea and fogginess with Sinemet (carbidopa/levodopa) 25/100   09/10/2017 PDQ Total Percent: 35.26 %   Total Mentation Score: 4 Total Activities of Daily Living Score: 15 Total Motor Exam: 29 Total UPDRS Score: 48   01/13/2018 Side effects to Mirapex (pramipexole)   06/11/2018 PDQ8 Total %: 34   Rytary (carbidopa/levodopa) extended release capsules causing side effects  03/03/2019 PDQ Total Percent: 33.97 %   Total Mentation Score: 5 Total Activities of Daily Living Score: 21 Total Motor Exam: 32 Total UPDRS Score: 58   06/29/2019 PDQ8 Total %: 41   01/05/2020 PDQ8 Total %: 34      Hallucination    04/15/2019 On Nuplazid (pimavanserin): worse when discontinued  06/29/2019 On Seroquel (quetiapine)       At Risk for Falls    09/08/2015 Fall Risk assessment Score 11  04/15/2019 Falls Risk Score:   13 Timed Up and Go with Pushing: 26 Seconds(walker)     06/29/2019 Timed Up and Go with Pushing: 17 Seconds(walker)   01/05/2020 Falls Risk Score:   10 Timed Up and Go with Pushing: 28 Seconds(UStep walker)        Depression    05/06/2014 Geriatric Depression  Scale: 13  12/06/2014 On Paxil (paroxetine)   05/27/2015 Wellbutrin (bupropion) caused nausea  05/27/2015 Geriatric Depression Scale: 12 Does not like Paxil (paroxetine)   03/03/2019 Geriatric Depression Scale: 12      Neurogenic orthostatic hypotension (HCC)    05/06/2014 On Florinef (fludrocortisone)    Has tried Proamatine (midodrine)   02/12/2017 Off Florinef (fludrocortisone)           Parkinson's disease with Bilateral STN Implants  Her symptoms are stable. No medication changes were recommended during the current  visit. IPG settings were not changed.      Neurogenic orthostatic hypotension (HCC)  Her symptoms are not bothersome and no medication changes were recommended during the current visit.       At risk for falls  Fall precautions discussed.     Depression  Her symptoms are stable. No medication changes were recommended during the current  visit.      Hallucination  Her symptoms are stable. No medication changes were recommended during the current  visit.      Patient will follow up in approximately 6 months.

## 2020-01-05 NOTE — Assessment & Plan Note
Her symptoms are stable. No medication changes were recommended during the current  visit.

## 2020-01-05 NOTE — Assessment & Plan Note
Her symptoms are stable. No medication changes were recommended during the current  visit. IPG settings were not changed.

## 2020-01-05 NOTE — Assessment & Plan Note
Fall precautions discussed.

## 2020-01-05 NOTE — Assessment & Plan Note
Her symptoms are not bothersome and no medication changes were recommended during the current visit.

## 2020-01-06 ENCOUNTER — Encounter: Admit: 2020-01-06 | Discharge: 2020-01-06 | Payer: MEDICARE

## 2020-01-19 ENCOUNTER — Emergency Department

## 2020-01-19 MED ORDER — ACETAMINOPHEN 500 MG PO TAB
1000 mg | Freq: Once | ORAL | 0 refills | Status: CP
Start: 2020-01-19 — End: ?
  Administered 2020-01-19: 14:00:00 1000 mg via ORAL

## 2020-01-19 MED ORDER — IOHEXOL 350 MG IODINE/ML IV SOLN
100 mL | Freq: Once | INTRAVENOUS | 0 refills | Status: CP
Start: 2020-01-19 — End: ?
  Administered 2020-01-19: 13:00:00 100 mL via INTRAVENOUS

## 2020-01-19 MED ORDER — SODIUM CHLORIDE 0.9 % IJ SOLN
50 mL | Freq: Once | INTRAVENOUS | 0 refills | Status: CP
Start: 2020-01-19 — End: ?
  Administered 2020-01-19: 13:00:00 50 mL via INTRAVENOUS

## 2020-01-28 ENCOUNTER — Encounter: Admit: 2020-01-28 | Discharge: 2020-01-28 | Payer: MEDICARE

## 2020-01-28 NOTE — Patient Instructions
It was a pleasure seeing you in clinic today.    Verenice Westrich RN, CNC  Dr. Joshua T. Bunch/Ortho Spine Surgery  [facility]  [facility]  [address]  [place], [place] [address]  Phone: [phone]   Fax [phone]  Scheduling [phone]  [email or web address]

## 2020-01-29 ENCOUNTER — Encounter: Admit: 2020-01-29 | Discharge: 2020-01-29 | Payer: MEDICARE

## 2020-01-29 ENCOUNTER — Ambulatory Visit: Admit: 2020-01-29 | Discharge: 2020-01-29 | Payer: MEDICARE

## 2020-01-29 DIAGNOSIS — Z974 Presence of external hearing-aid: Secondary | ICD-10-CM

## 2020-01-29 DIAGNOSIS — R2689 Other abnormalities of gait and mobility: Secondary | ICD-10-CM

## 2020-01-29 DIAGNOSIS — M4800 Spinal stenosis, site unspecified: Secondary | ICD-10-CM

## 2020-01-29 DIAGNOSIS — L84 Corns and callosities: Secondary | ICD-10-CM

## 2020-01-29 DIAGNOSIS — K219 Gastro-esophageal reflux disease without esophagitis: Secondary | ICD-10-CM

## 2020-01-29 DIAGNOSIS — S32001A Stable burst fracture of unspecified lumbar vertebra, initial encounter for closed fracture: Secondary | ICD-10-CM

## 2020-01-29 DIAGNOSIS — T50905A Adverse effect of unspecified drugs, medicaments and biological substances, initial encounter: Secondary | ICD-10-CM

## 2020-01-29 DIAGNOSIS — S72009A Fracture of unspecified part of neck of unspecified femur, initial encounter for closed fracture: Secondary | ICD-10-CM

## 2020-01-29 DIAGNOSIS — E049 Nontoxic goiter, unspecified: Secondary | ICD-10-CM

## 2020-01-29 DIAGNOSIS — C801 Malignant (primary) neoplasm, unspecified: Secondary | ICD-10-CM

## 2020-01-29 DIAGNOSIS — I10 Essential (primary) hypertension: Secondary | ICD-10-CM

## 2020-01-29 DIAGNOSIS — R002 Palpitations: Secondary | ICD-10-CM

## 2020-01-29 DIAGNOSIS — R011 Cardiac murmur, unspecified: Secondary | ICD-10-CM

## 2020-01-29 DIAGNOSIS — S129XXD Fracture of neck, unspecified, subsequent encounter: Secondary | ICD-10-CM

## 2020-01-29 DIAGNOSIS — F419 Anxiety disorder, unspecified: Secondary | ICD-10-CM

## 2020-01-29 DIAGNOSIS — G20 Parkinson's disease: Secondary | ICD-10-CM

## 2020-02-03 ENCOUNTER — Encounter: Admit: 2020-02-03 | Discharge: 2020-02-03 | Payer: MEDICARE

## 2020-02-03 MED FILL — NUPLAZID 34 MG PO CAP: 34 mg | ORAL | 30 days supply | Qty: 30 | Fill #2 | Status: AC

## 2020-02-04 ENCOUNTER — Ambulatory Visit: Admit: 2020-02-04 | Discharge: 2020-02-05 | Payer: MEDICARE

## 2020-02-04 ENCOUNTER — Encounter: Admit: 2020-02-04 | Discharge: 2020-02-04 | Payer: MEDICARE

## 2020-02-04 DIAGNOSIS — I10 Essential (primary) hypertension: Secondary | ICD-10-CM

## 2020-02-04 DIAGNOSIS — C801 Malignant (primary) neoplasm, unspecified: Secondary | ICD-10-CM

## 2020-02-04 DIAGNOSIS — L84 Corns and callosities: Secondary | ICD-10-CM

## 2020-02-04 DIAGNOSIS — R002 Palpitations: Secondary | ICD-10-CM

## 2020-02-04 DIAGNOSIS — E049 Nontoxic goiter, unspecified: Secondary | ICD-10-CM

## 2020-02-04 DIAGNOSIS — T50905A Adverse effect of unspecified drugs, medicaments and biological substances, initial encounter: Secondary | ICD-10-CM

## 2020-02-04 DIAGNOSIS — R2689 Other abnormalities of gait and mobility: Secondary | ICD-10-CM

## 2020-02-04 DIAGNOSIS — R011 Cardiac murmur, unspecified: Secondary | ICD-10-CM

## 2020-02-04 DIAGNOSIS — S72009A Fracture of unspecified part of neck of unspecified femur, initial encounter for closed fracture: Secondary | ICD-10-CM

## 2020-02-04 DIAGNOSIS — Z974 Presence of external hearing-aid: Secondary | ICD-10-CM

## 2020-02-04 DIAGNOSIS — M4800 Spinal stenosis, site unspecified: Secondary | ICD-10-CM

## 2020-02-04 DIAGNOSIS — F419 Anxiety disorder, unspecified: Secondary | ICD-10-CM

## 2020-02-04 DIAGNOSIS — G20 Parkinson's disease: Secondary | ICD-10-CM

## 2020-02-04 DIAGNOSIS — K219 Gastro-esophageal reflux disease without esophagitis: Secondary | ICD-10-CM

## 2020-02-04 MED ORDER — MYRBETRIQ 25 MG PO TB24
25 mg | ORAL_TABLET | Freq: Every day | ORAL | 3 refills | Status: AC
Start: 2020-02-04 — End: ?

## 2020-02-04 NOTE — Patient Instructions
Urinary Urgency Suppression Instructions    When you experience an urge to void:    First:  Stop & stand still.  Sit down if you can.  Don't move.  You need to stay very still to maintain control.    Second:  Relax.  Take a deep breath, then let it out.  Try to think of something other than the bathroom.  Try to make the urge go away.    Third:  Contract your pelvic muscles 3 to 4 times to keep from leaking.  Finally, when you feel the urge go away, walk normally to the bathroom.    If the urge recurs on the way, stop & repeat.  Practice this at home until you are able to suppress the urge & feel confident.  Do not be discouraged if control is not immediate.    Adapted from [facility].  Treatment program 6-34.  =======================================================================    Kegel Exercises    1.  During urination, attempt to stop your stream.  The muscle used to stop urination is called the external urinary sphincter.    2.  After you have correctly identified this muscle, you should follow a sphincter strengthening program outside of urination.    Follow this Schedule:  -- Slowly contract your sphincter muscle to a count of five.  Slowly release to a count of five.  Repeat 10 times. This is called a set.   -- It is important not to tense other muscles during this exercise.  You're your abdominal (belly), thigh, and butt muscles relaxed while you are doing Kegel exercises.    -- Repeat a set of exercises at least four times daily or up to once every hour.  To help you remember a time schedule, do a set at breakfast, lunch, dinner and bedtime.  Vary the position in which you do the exercises such as standing, lying, walking, and driving.  Different positions will strengthen the muscles in different ways.    -- To help prevent leakage with increased abdominal pressure, you may do a single Kegel squeeze and count of five when you are bending, stooping, getting up out of a chair, or bed or when lifting.    -- It is normal to experience leakage for several weeks or even several months after surgery.  Time, patience, and Kegel exercises will help restore your urinary control.    -- Guards for Men [employer]) is a pad that fits into your jockey shorts to absorb small to moderate amounts of urine.  It has an adhesive strip which adheres to the inside of your underwear.  You may purchase these pads at any major pharmacy or discount store.  For large amounts of leakage, you may wish to purchase Depends underwear.  It's a full brief that you use in place of underwear.  ====================================================================================    Here is a list of options for overactive bladder medications on the market.  If necessary, contact your insurance company to determine which one(s) may be the least expensive.  Contact your provider?s office if you want to switch to a different medication & the name of the preferred medication(s) as recommended by your insurance company.    Brand Name Generic Name  Detrol  Tolterodine  Detrol LA Tolterodine ER  Ditropan Oxybutynin  Ditropan XL Oxybutynin ER  Enablex Darifenacin  Gelnique Oxybutynin Gel  Myrbetriq Mirabegron  Sanctura Trospium  Sanctura XR Trospium ER  Toviaz  Fesoterodine  Vesicare Solifenacin    ========================================================================

## 2020-02-05 DIAGNOSIS — N3941 Urge incontinence: Secondary | ICD-10-CM

## 2020-03-04 ENCOUNTER — Encounter: Admit: 2020-03-04 | Discharge: 2020-03-04 | Payer: MEDICARE

## 2020-03-04 MED FILL — NUPLAZID 34 MG PO CAP: 34 mg | ORAL | 30 days supply | Qty: 30 | Fill #3 | Status: AC

## 2020-03-04 NOTE — Progress Notes
Copay assistance of $3200 was obtained for the specialty medication Nuplazid using grant from [facility] and now the copay is $0.00.  Alexis Matthews has stated this copay is affordable.  The specialty pharmacy will pursue additional copay assistance as necessary.  The specialty pharmacy will reach out to the ambulatory clinical pharmacist if the copay becomes unaffordable.    The medication will be delivered to patient's prescription address per the patient's request.    Foye Spurling  Pharmacy Patient Advocate  [phone]

## 2020-03-06 ENCOUNTER — Encounter: Admit: 2020-03-06 | Discharge: 2020-03-06 | Payer: MEDICARE

## 2020-03-06 DIAGNOSIS — M4800 Spinal stenosis, site unspecified: Secondary | ICD-10-CM

## 2020-03-06 DIAGNOSIS — C801 Malignant (primary) neoplasm, unspecified: Secondary | ICD-10-CM

## 2020-03-06 DIAGNOSIS — E049 Nontoxic goiter, unspecified: Secondary | ICD-10-CM

## 2020-03-06 DIAGNOSIS — R011 Cardiac murmur, unspecified: Secondary | ICD-10-CM

## 2020-03-06 DIAGNOSIS — L84 Corns and callosities: Secondary | ICD-10-CM

## 2020-03-06 DIAGNOSIS — I10 Essential (primary) hypertension: Secondary | ICD-10-CM

## 2020-03-06 DIAGNOSIS — G20 Parkinson's disease: Secondary | ICD-10-CM

## 2020-03-06 DIAGNOSIS — Z974 Presence of external hearing-aid: Secondary | ICD-10-CM

## 2020-03-06 DIAGNOSIS — R002 Palpitations: Secondary | ICD-10-CM

## 2020-03-06 DIAGNOSIS — F419 Anxiety disorder, unspecified: Secondary | ICD-10-CM

## 2020-03-06 DIAGNOSIS — S72009A Fracture of unspecified part of neck of unspecified femur, initial encounter for closed fracture: Secondary | ICD-10-CM

## 2020-03-06 DIAGNOSIS — R2689 Other abnormalities of gait and mobility: Secondary | ICD-10-CM

## 2020-03-06 DIAGNOSIS — T50905A Adverse effect of unspecified drugs, medicaments and biological substances, initial encounter: Secondary | ICD-10-CM

## 2020-03-06 DIAGNOSIS — K219 Gastro-esophageal reflux disease without esophagitis: Secondary | ICD-10-CM

## 2020-04-05 ENCOUNTER — Encounter: Admit: 2020-04-05 | Discharge: 2020-04-05 | Payer: MEDICARE

## 2020-04-05 MED FILL — NUPLAZID 34 MG PO CAP: 34 mg | ORAL | 30 days supply | Qty: 30 | Fill #4 | Status: AC

## 2020-04-07 ENCOUNTER — Encounter: Admit: 2020-04-07 | Discharge: 2020-04-07 | Payer: MEDICARE

## 2020-04-07 NOTE — Telephone Encounter
Needs to see her

## 2020-04-07 NOTE — Telephone Encounter
Patient son called reporting his mom is struggling in assisted care facility.  He has hired someone to entertain her 5 days a week until dinner time.  He feels if she is distracted and has something to do she is better. Over the weekend and still during the daytime usually she is agitated, needing to do something all the time, she is seeing thinks that are not real and she is thinking he husband is having an affair.  She even said she saw the affair on the floor in her living room.  He said mainly she is grumpy and in a bad mood and that she needs a happy pill.  Instructed Eric to have the facility send me a fax the current medication schedule.

## 2020-04-07 NOTE — Telephone Encounter
Medication list obtain from [facility] living and updated medication list.  Changes are as follows, increase of the mybretriq to 50 from 25, and increase in quetiapine to 25 by mouth one time a day and then 50 mg at bedtime which is an increase.  She also have paxil on this med list which we stopped its at 20 mgs once a day. There are other  meds which I will add and change but these are the significant changes noted since last here. See medication list for dates of changes if I could see them, noted them on the medications that have changed.  Suggestions?

## 2020-04-12 ENCOUNTER — Encounter: Admit: 2020-04-12 | Discharge: 2020-04-12 | Payer: MEDICARE

## 2020-04-12 NOTE — Telephone Encounter
Received voicemail from patient's son, Minerva Areola reporting patient needs to reschedule appointment, as she has a cough and she may have been exposed to COVID at her facility. Patient rescheduled and encouraged son to contact clinic if further rescheduling is needed.

## 2020-05-03 ENCOUNTER — Encounter: Admit: 2020-05-03 | Discharge: 2020-05-03 | Payer: MEDICARE

## 2020-05-03 MED FILL — NUPLAZID 34 MG PO CAP: 34 mg | ORAL | 30 days supply | Qty: 30 | Fill #5 | Status: AC

## 2020-05-04 ENCOUNTER — Encounter: Admit: 2020-05-04 | Discharge: 2020-05-04 | Payer: MEDICARE

## 2020-05-04 NOTE — Telephone Encounter
Received voicemail from patient requesting a call back. Returned call to number provided and spoke with spouse. He reports patient has COVID-19 and can't make it in to the doctor. Inquired if patient needs to reschedule her appointment that was originally rescheduled to 8/17, and he reports it needs to be rescheduled. Informed I will cancel appointment and have Gerlene Fee contact them when she is back in the office to reschedule.

## 2020-06-02 ENCOUNTER — Encounter: Admit: 2020-06-02 | Discharge: 2020-06-02 | Payer: MEDICARE

## 2020-06-02 MED FILL — NUPLAZID 34 MG PO CAP: 34 mg | ORAL | 30 days supply | Qty: 30 | Fill #6 | Status: AC

## 2020-06-03 ENCOUNTER — Encounter: Admit: 2020-06-03 | Discharge: 2020-06-03 | Payer: MEDICARE

## 2020-06-08 ENCOUNTER — Encounter: Admit: 2020-06-08 | Discharge: 2020-06-08 | Payer: MEDICARE

## 2020-06-08 ENCOUNTER — Ambulatory Visit: Admit: 2020-06-08 | Discharge: 2020-06-08 | Payer: MEDICARE

## 2020-06-08 DIAGNOSIS — R002 Palpitations: Secondary | ICD-10-CM

## 2020-06-08 DIAGNOSIS — T50905A Adverse effect of unspecified drugs, medicaments and biological substances, initial encounter: Secondary | ICD-10-CM

## 2020-06-08 DIAGNOSIS — E049 Nontoxic goiter, unspecified: Secondary | ICD-10-CM

## 2020-06-08 DIAGNOSIS — Z974 Presence of external hearing-aid: Secondary | ICD-10-CM

## 2020-06-08 DIAGNOSIS — S72009A Fracture of unspecified part of neck of unspecified femur, initial encounter for closed fracture: Secondary | ICD-10-CM

## 2020-06-08 DIAGNOSIS — R011 Cardiac murmur, unspecified: Secondary | ICD-10-CM

## 2020-06-08 DIAGNOSIS — R443 Hallucinations, unspecified: Secondary | ICD-10-CM

## 2020-06-08 DIAGNOSIS — F3289 Other specified depressive episodes: Secondary | ICD-10-CM

## 2020-06-08 DIAGNOSIS — F419 Anxiety disorder, unspecified: Secondary | ICD-10-CM

## 2020-06-08 DIAGNOSIS — R2689 Other abnormalities of gait and mobility: Secondary | ICD-10-CM

## 2020-06-08 DIAGNOSIS — C801 Malignant (primary) neoplasm, unspecified: Secondary | ICD-10-CM

## 2020-06-08 DIAGNOSIS — K219 Gastro-esophageal reflux disease without esophagitis: Secondary | ICD-10-CM

## 2020-06-08 DIAGNOSIS — M4800 Spinal stenosis, site unspecified: Secondary | ICD-10-CM

## 2020-06-08 DIAGNOSIS — G20 Parkinson's disease: Secondary | ICD-10-CM

## 2020-06-08 DIAGNOSIS — I10 Essential (primary) hypertension: Secondary | ICD-10-CM

## 2020-06-08 DIAGNOSIS — L84 Corns and callosities: Secondary | ICD-10-CM

## 2020-06-08 MED ORDER — QUETIAPINE 25 MG PO TAB
50 mg | Freq: Every evening | ORAL | 0 refills | Status: AC
Start: 2020-06-08 — End: ?

## 2020-06-08 NOTE — Progress Notes
Alexis Matthews is a 74 y.o. female.    Subjective:             History of Present Illness      Parkinson's Disease Follow-Up Visit    Since my last visit I am: Slightly better    Symptoms Scale:    Memory problems: Moderate, affects some things  Hallucinations/delusions: Slight, sense of presence  Depression: Mild, occurs due to a reason  Anxiety: Mild, can be bothersome  Apathy: None  Impulsive behavior: None  Nighttime sleep: No difficulty  Daytime sleepiness: Slight, occasionally  Vivid dreams: None  REM sleep behavior disorder: Mild, frequently talk but not disturbing  Restless leg syndrome: None  Pain or muscle cramps: Mild, muscle or joint pain need medications  Urination: Slight, go frequently, not bothersome  Constipation: None  Dizziness or lightheadedness: Slight, occasional on standing  Tiredness/Fatigue: None  Falling: Moderate, multiple times a month  Personal care assistance: Marked, I have a lot of difficulty and I need help with a majority of tasks daily    Total Score:  Total Score: 22    Assistive Devices:  Assistive devices for getting around: Walker    OFF Time:    OFF time: No    Dyskinesia:    Dyskinesia while awake: No    Employment Status:    Employment: Retired - not due to PD          Review of Systems   HENT: Positive for trouble swallowing.    Gastrointestinal: Negative.    Genitourinary: Positive for enuresis and urgency. Negative for decreased urine volume, difficulty urinating, dyspareunia, dysuria, flank pain, frequency, genital sores, hematuria, menstrual problem, pelvic pain, vaginal bleeding, vaginal discharge and vaginal pain.   Neurological: Positive for speech difficulty.         Objective:         ? benzonatate (TESSALON PERLES) 100 mg capsule Take 100 mg by mouth every 8 hours.   ? cholecalciferol (VITAMIN D-3) 400 unit tab tablet Take 1,000 Units by mouth daily.   ? cyanocobalamin (RUBRAMIN) 1,000 mcg/mL injection Inject 1 mL into the muscle every 30 days.   ? duloxetine DR (CYMBALTA) 60 mg capsule TAKE 1 CAPSULE BY MOUTH EVERY DAY   ? famotidine (PEPCID) 20 mg tablet Take 40 mg by mouth at bedtime daily.   ? fluticasone propionate (FLONASE) 50 mcg/actuation nasal spray, suspension Apply 2 sprays to each nostril as directed daily. Shake bottle gently before using.   ? guaiFENesin (ROBITUSSIN) 100 mg/5 mL oral solution Take 10 mL by mouth every 4 hours as needed.   ? ibuprofen (MOTRIN) 400 mg tablet Take 400 mg by mouth every 6 hours as needed for Pain. Take with food.   ? levothyroxine (SYNTHROID) 100 mcg tablet Take 100 mcg by mouth daily 30 minutes before breakfast. Indications: BRAND NAME ONLY   ? lidocaine (ASPERCREME PATCH) 4 % topical patch Apply 1 patch topically to affected area daily.   ? loratadine (CLARITIN) 10 mg tablet Take 10 mg by mouth every morning.   ? LORazepam (ATIVAN) 0.5 mg tablet Take 0.5 mg by mouth at bedtime daily.   ? melatonin 3 mg tab Take 5 mg by mouth at bedtime daily. Takes 5 mg at hs.   ? mirabegron ER (MYRBETRIQ) 25 mg tablet Take one tablet by mouth daily. (Patient taking differently: Take 50 mg by mouth daily. Increased to 50 on 03/12/2020)   ? omeprazole DR(+) (PRILOSEC) 40 mg capsule Take 40  mg by mouth daily before breakfast.   ? ondansetron (ZOFRAN) 4 mg tablet Take 4 mg by mouth every 12 hours as needed for Nausea or Vomiting.   ? PARoxetine (PAXIL) 20 mg tablet Take 20 mg by mouth daily.   ? pimavanserin (NUPLAZID) 34 mg capsule Take one capsule by mouth daily.   ? QUEtiapine (SEROQUEL) 25 mg tablet Take two tablets by mouth at bedtime daily.       Body mass index is 24.96 kg/m?Marland Kitchen     Physical Exam    EXAMINATION:     ORIENTATION: Alert and Oriented.    Cranial Nerves:  reduced facial expression and soft voice.        Right Left   Bradkinesia  Mild Mild   Tremor Hands Resting None None    Postural None None    Kinetic None None           Tremor Other: None  Dyskinesia: None  Muscle Strength: Unremarkable  Gait: Needs assistance to walk PDQ8 - Quality of Life  Had difficulty getting around in public places?: Always   Had difficulty dressing?: Always   Felt depressed?: Occasionally   Had problems with your close personal relationships?: Occasionally     Had problems with your concentration, for example, when reading or watching TV?: Sometimes   Felt unable to communicate effectively?: Occasionally   Had painful muscle cramps or spasms?: Often   Felt embarrassed in public due to having Parkinson's disease?: Never              PDQ8 Total Score (32 Possible): 16  PDQ8 Total %: 50                               Falls Risk Score:   10  High risk of falls                          Assessment and Plan:    Problem   Parkinson's disease with Bilateral STN Implants    Symptoms began in 2001, initial symptoms was right hand tremor. Diagnosed with Parkinson's disease in 2001. Atypical PD Features: None  Patient has difficulty tolerating Sinemet (carbidopa/levodopa) and dopamine agonists.   09/29/08 UPDRS Motor OFF 36  Underwent bilateral Subthalamic stimulator implants in 2010 for bothersome tremor with marked improvement.   03/24/2012 Mentation Score: 2 Activities of Daily Living Score: 4 Motor Exam:19 Total UPDRS Score: 25    PDQ 39 IMPACT PDQ Total Percent: 6.41 %  04/30/2013  Mentation Score: 3 Activities of Daily Living Score: 13 Motor Exam: 28 Total UPDRS Score: 44   PDQ 39 IMPACT PDQ Total Percent: 5.77 %   05/06/2014 Total Mentation Score: 5 Total Activities of Daily Living Score: 7 Total Motor Exam: 18 Total UPDRS Score: 30  PDQ Total Percent: 17.95 %  05/27/2015 Total Mentation Score: 4 Total Activities of Daily Living Score: 16 Total Motor Exam: 34 Total UPDRS Score: 54   PDQ Total Percent: 33.33 %   Patient was evaluated by Occupational Therapist, Kelli Reiling.    07/17/2016 Total Mentation Score: 4 Total Activities of Daily Living Score: 13 Total Motor Exam: 33 Total UPDRS Score: 50   Nausea and fogginess with Sinemet (carbidopa/levodopa) 25/100 09/10/2017 PDQ Total Percent: 35.26 %   Total Mentation Score: 4 Total Activities of Daily Living Score: 15 Total Motor Exam: 29 Total UPDRS Score: 48   01/13/2018  Side effects to Mirapex (pramipexole)   06/11/2018 PDQ8 Total %: 34   Rytary (carbidopa/levodopa) extended release capsules causing side effects  03/03/2019 PDQ Total Percent: 33.97 %   Total Mentation Score: 5 Total Activities of Daily Living Score: 21 Total Motor Exam: 32 Total UPDRS Score: 58   06/29/2019 PDQ8 Total %: 41   01/05/2020 PDQ8 Total %: 34      Hallucination    04/15/2019 On Nuplazid (pimavanserin): worse when discontinued  06/29/2019 On Seroquel (quetiapine)       Depression    05/06/2014 Geriatric Depression Scale: 13  12/06/2014 On Paxil (paroxetine)   05/27/2015 Wellbutrin (bupropion) caused nausea  05/27/2015 Geriatric Depression Scale: 12 Does not like Paxil (paroxetine)   03/03/2019 Geriatric Depression Scale: 12          Parkinson's disease with Bilateral STN Implants  Her symptoms are stable. No medication changes were recommended during the current  visit. IPG settings were not changed.      Depression  Her symptoms are stable. No medication changes were recommended during the current  visit.      Hallucination  Her symptoms are improved since last visit and no medication changes were recommended during the current visit.      Patient will follow up in approximately 6 months.

## 2020-06-08 NOTE — Assessment & Plan Note
Her symptoms are stable. No medication changes were recommended during the current  visit. IPG settings were not changed.

## 2020-06-08 NOTE — Assessment & Plan Note
Her symptoms are improved since last visit and no medication changes were recommended during the current visit.

## 2020-06-08 NOTE — Assessment & Plan Note
Her symptoms are stable. No medication changes were recommended during the current  visit.

## 2020-06-09 ENCOUNTER — Ambulatory Visit: Admit: 2020-06-09 | Discharge: 2020-06-10 | Payer: MEDICARE

## 2020-06-09 ENCOUNTER — Encounter: Admit: 2020-06-09 | Discharge: 2020-06-09 | Payer: MEDICARE

## 2020-06-09 DIAGNOSIS — R350 Frequency of micturition: Secondary | ICD-10-CM

## 2020-06-09 DIAGNOSIS — M4800 Spinal stenosis, site unspecified: Secondary | ICD-10-CM

## 2020-06-09 DIAGNOSIS — N3941 Urge incontinence: Secondary | ICD-10-CM

## 2020-06-09 DIAGNOSIS — R011 Cardiac murmur, unspecified: Secondary | ICD-10-CM

## 2020-06-09 DIAGNOSIS — Z974 Presence of external hearing-aid: Secondary | ICD-10-CM

## 2020-06-09 DIAGNOSIS — G20 Parkinson's disease: Secondary | ICD-10-CM

## 2020-06-09 DIAGNOSIS — L84 Corns and callosities: Secondary | ICD-10-CM

## 2020-06-09 DIAGNOSIS — E049 Nontoxic goiter, unspecified: Secondary | ICD-10-CM

## 2020-06-09 DIAGNOSIS — R002 Palpitations: Secondary | ICD-10-CM

## 2020-06-09 DIAGNOSIS — C801 Malignant (primary) neoplasm, unspecified: Secondary | ICD-10-CM

## 2020-06-09 DIAGNOSIS — R2689 Other abnormalities of gait and mobility: Secondary | ICD-10-CM

## 2020-06-09 DIAGNOSIS — I10 Essential (primary) hypertension: Secondary | ICD-10-CM

## 2020-06-09 DIAGNOSIS — F419 Anxiety disorder, unspecified: Secondary | ICD-10-CM

## 2020-06-09 DIAGNOSIS — S72009A Fracture of unspecified part of neck of unspecified femur, initial encounter for closed fracture: Secondary | ICD-10-CM

## 2020-06-09 DIAGNOSIS — T50905A Adverse effect of unspecified drugs, medicaments and biological substances, initial encounter: Secondary | ICD-10-CM

## 2020-06-09 DIAGNOSIS — R3915 Urgency of urination: Secondary | ICD-10-CM

## 2020-06-09 DIAGNOSIS — K219 Gastro-esophageal reflux disease without esophagitis: Secondary | ICD-10-CM

## 2020-06-09 NOTE — Patient Instructions
What is a suprapubic catheter?    A suprapubic catheter (sometimes called an [facility]) is a device that?s inserted into your bladder to drain urine if you can?t urinate on your own.  Normally, a catheter is inserted into your bladder through your urethra, the tube that you usually urinate out of. An SPC is inserted a couple of inches below your navel, or belly button, directly into your bladder, just above your pubic bone. This allows urine to be drained without having a tube going through your genital area.  SPCs are usually more comfortable than regular catheters because they aren?t inserted through your urethra, which is full of sensitive tissue. Your doctor may use an SPC if your urethra isn?t able to safely hold a catheter.  What is a suprapubic catheter used for?  An SPC drains urine directly out of your bladder if you?re not able to urinate by yourself. Some conditions that may require you to use a catheter include:  urinary retention (can?t urinate on your own)   urinary incontinence (leakage)   pelvic organ prolapse   spinal injuries or trauma   lower body paralysis   multiple sclerosis (MS)   Parkinson?s disease   benign prostatic hyperplasia (BPH)   bladder cancer  You may be given an SPC instead of a normal catheter for several reasons:  You?re not as likely to get an infection.   The tissue around your genitals isn?t as likely to get damaged.   Your urethra may be too damaged or sensitive to hold a catheter.   You?re healthy enough to stay sexually active even though you need a catheter.   You?ve just had surgery on your bladder, urethra, uterus, penis, or other organ that?s near your urethra.   You spend most or all your time in a wheelchair, in which case an [facility] catheter is easier to take care of.  How is this device inserted?  Your doctor will insert and change your catheter the first few times after you?re given one. Then, your doctor may permit you to take care of your catheter at home.  First, your doctor may take X-rays or perform an ultrasound on the area to check for any abnormalities around your bladder area.  Your doctor will likely use the Stamey procedure to insert your catheter if your bladder is distended. This means that it?s overfilled with urine. In this procedure, your doctor:  1. Prepares the bladder area with iodine and cleaning solution.  2. Locates your bladder by gently feeling around the area.  3. Uses local anesthesia to numb the area.  4. Inserts a catheter using a Stamey device. This helps guide the catheter in with a piece of metal called an obturator.  5. Removes the obturator once the catheter is in your bladder.  6. Inflates a balloon at the end of the catheter with water to keep it from falling out.  7. Cleans the insertion area and stitches up the opening.  Your doctor may also give you a bag that?s attached to your leg for the urine to drain into. In some cases, the catheter itself may simply have a valve on it that allows you to drain the urine into a toilet whenever needed.  Are there any possible complications?  SPC insertion is a short, safe procedure that usually has few complications. Before the insertion, your doctor may recommend taking antibiotics if you?ve had a heart valve replacement or are taking any blood thinners.  Possible minor complications of  an SPC insertion include:  urine not draining properly   urine leaking out of your catheter   small amounts of blood in your urine  You may be required to stay in the clinic or hospital if your doctor notices any complications that need immediate treatment, such as:  high fever   abnormal abdominal pain   infection   discharge from the insertion area or urethra   internal bleeding (hemorrhage)   a hole in the bowel area (perforation)   stones or pieces of tissue in your urine  See your doctor as soon as possible if your catheter falls out at home, as it needs to be reinserted so that the opening doesn?t close.  How long should this device stay inserted?  An SPC usually stays inserted for four to eight weeks before it needs to be changed or removed. It may be removed sooner if your doctor believes that you?re able to urinate on your own again.  To remove an [facility], your doctor:  1. Covers the area around your bladder with underpads so that urine doesn?t get on you.  2. Checks the insertion area for any swelling or irritation.  3. Deflates the balloon at the end of the catheter.  4. Pinches the catheter right where it enters the skin and slowly pulls it out.  5. Cleans and sterilizes the insertion area.  6. Stitches the opening shut.  What should I do or not do while this device is inserted?  Do?s  Drink 8 to 12 glasses of water every day.   Empty your urine bag several times a day.   Wash your hands whenever you handle your urine bag.   Clean the insertion area with hot water twice a day.   Turn your catheter when you clean it so that it doesn?t stick to your bladder.   Keep any dressings on the area until the insertion area is healed.   Tape the catheter tube to your body so it doesn?t slip or pull.   Eat foods to help you avoid constipation, such as fiber, fruits, and vegetables.   Continue any regular sexual activity.  Don?ts  Don?t use any powders or creams around the insertion area.   Don?t take baths or immerse your insertion area in water for a long time.   Don?t shower without covering the area with a waterproof dressing.   Don?t reinsert the catheter yourself if it falls out.  The takeaway  An SPC is a more comfortable alternative to a regular catheter and allows you to continue your normal daily activities without discomfort or pain. It?s also easy to cover with clothing or dressing if you want to keep it private.  An SPC may only be used temporarily after surgery or treatment of certain conditions, but it may need to remain in place permanently in some cases. Talk to your doctor about how to take care of and change your catheter if you need to keep it in for a long period of time.

## 2020-06-09 NOTE — Progress Notes
Date of Service: 06/09/2020    Subjective:             Alexis Matthews is a 74 y.o. female.    History of Present Illness  Ms. Woodland presents to clinic for follow-up accompanied by her daughter. She was seen once previously on 5?13?2021. She has a history of urinary urgency, frequency, and urge urinary incontinence. This is been worse in the morning. She does have a history of Parkinson's disease which is probably contributing significantly to her symptoms. She has had multiple falls in the past. At her last visit we recommended medications including Myrbetriq 50 mg/day. She is taking that. We also discussed behavioral therapy.    Patient recently saw her neurologist. Review of those records indicate that they feel that her Parkinson's disease symptoms are stable and that her deep brain stimulator is functioning.    Today, the patient indicates that her symptoms are about the same. The Myrbetriq has not really helped much. She is still bothered by her urgency and urge incontinence. We discussed many options including the possibility of adding a second medication in a different class. We also discussed catheterization or Botox injection. She actually brought up the possibility of a catheter. We reviewed risks and benefits of urethral or suprapubic tube catheters. At this time she does not really want to proceed with a catheter. She is not particularly interested in Botox. I think she would have difficulty doing clean intermittent catheterization if she were to have retention problems with that.      Medical History:   Diagnosis Date   ? Acid reflux    ? Adverse drug reaction    ? Anxiety and depression    ? Balance problem 2016   ? 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 Enlarged thyroid    ? Foot callus 2014    left   ? Heart murmur    ? Hip fracture (HCC)    ? Hypertension    ? Palpitation    ? Parkinson disease (HCC)    ? Spinal stenosis    ? Wears hearing aid in both ears      Surgical History: Procedure Laterality Date   ? LAPAROSCOPY  1973   ? GASTRIC FUNDOPLICATION  2008   ? DEEP BRAIN STIMULATOR PLACEMENT  2010    STN implants   ? PR OPEN TX RIB FRACTURE W/INT FIX UNI 1-2 RIBS Left Feb 2017   ? REMOVAL OF CURRENT SINGLE CHANNEL DEEP BRAIN STIMULATION INTERMITTENT PULSE GENERATORS, IMPLANTATION OF NEW SINGLE CHANNEL DEEP BRAIN STIMULATION INTERMITTENT PULSE GENERATORS, RIGHT CHEST AND LEFT CHEST Bilateral 10/03/2018    Performed by Alvino Blood, MD at [facility]   ? INTERTROCHANTERIC HIP FRACTURE SURGERY  Jule 12 th, 2014    right hip   ? SURGERY      Battery replacement in nerve stimulator     Social History     Socioeconomic History   ? Marital status: Married     Spouse name: Ed   ? Number of children: 2   ? Years of education: Not on file   ? Highest education level: Not on file   Occupational History   ? Not on file   Tobacco Use   ? Smoking status: Passive Smoke Exposure - Never Smoker   ? Smokeless tobacco: Never Used   Vaping Use   ? Vaping Use: Never used   Substance and Sexual Activity   ?  Alcohol use: No     Alcohol/week: 0.0 standard drinks   ? Drug use: No   ? Sexual activity: Not on file   Other Topics Concern   ? Military Service Not Asked   ? Blood Transfusions Not Asked   ? Caffeine Concern Not Asked   ? Occupational Exposure Not Asked   ? Hobby Hazards Not Asked   ? Sleep Concern Yes   ? Stress Concern Yes   ? Weight Concern Not Asked   ? Special Diet Not Asked   ? Back Care Yes   ? Exercise Not Asked   ? Bike Helmet Not Asked   ? Seat Belt Not Asked   ? Self-Exams Not Asked   Social History Narrative    06/2014    Married to Ed w/ 2 sons    4 brothers, 3 deceased, 2 w/cancer, 1 w/Crohns. 1 sister, good health    Dad MI w/CABG at 40, smoker, died at 62    Mom dies at 35     Family History   Problem Relation Age of Onset   ? Diabetes Mother    ? Hypertension Mother    ? Heart Failure Father    ? Coronary Artery Disease Father    ? Hypertension Father    ? High Cholesterol Father    ? Stroke Father    ? Other Brother         bronchiole cancer   ? Inflammatory Bowel Disease Brother         Crohn's   ? Coronary Artery Disease Paternal Grandfather    ? GI Cancer Brother         esopahgeal cancer   ? Cancer-Colon Neg Hx    ? Colon Polyps Neg Hx    ? Celiac Disease Neg Hx    ? Parkinson's  Neg Hx    ? Dementia Neg Hx      Allergies   Allergen Reactions   ? Latex SWELLING   ? Primidone MENTAL STATUS CHANGES     Does not like the feeling it gives her feels like I'm outside my body.   ? Celexa [Citalopram] STOMACH UPSET   ? Flu Vaccine [Influenza Virus Vaccines] NAUSEA AND VOMITING   ? Lisinopril SEE COMMENTS     Hypotension and stomach cramps   ? Neurontin [Gabapentin] DIZZINESS     Falls            Review of Systems   Constitutional: Negative for activity change, appetite change, chills, diaphoresis, fatigue, fever and unexpected weight change.   HENT: Negative for congestion, hearing loss, mouth sores and sinus pressure.    Eyes: Negative for visual disturbance.   Respiratory: Negative for apnea, cough, chest tightness and shortness of breath.    Cardiovascular: Negative for chest pain, palpitations and leg swelling.   Gastrointestinal: Negative for abdominal pain, blood in stool, constipation, diarrhea, nausea, rectal pain and vomiting.   Endocrine: Negative.    Genitourinary: Positive for enuresis, frequency and urgency. Negative for decreased urine volume, difficulty urinating, dyspareunia, dysuria, flank pain, genital sores, hematuria, pelvic pain, vaginal bleeding, vaginal discharge and vaginal pain.   Musculoskeletal: Negative for arthralgias, back pain, gait problem and myalgias.   Skin: Negative for rash and wound.   Allergic/Immunologic: Negative.    Neurological: Negative for dizziness, tremors, seizures, syncope, weakness, light-headedness, numbness and headaches.   Hematological: Negative for adenopathy. Does not bruise/bleed easily.   Psychiatric/Behavioral: Negative for decreased concentration and dysphoric mood.  The patient is not nervous/anxious.          Objective:         ? benzonatate (TESSALON PERLES) 100 mg capsule Take 100 mg by mouth every 8 hours.   ? cholecalciferol (VITAMIN D-3) 400 unit tab tablet Take 1,000 Units by mouth daily.   ? cyanocobalamin (RUBRAMIN) 1,000 mcg/mL injection Inject 1 mL into the muscle every 30 days.   ? duloxetine DR (CYMBALTA) 60 mg capsule TAKE 1 CAPSULE BY MOUTH EVERY DAY   ? famotidine (PEPCID) 20 mg tablet Take 40 mg by mouth at bedtime daily.   ? fluticasone propionate (FLONASE) 50 mcg/actuation nasal spray, suspension Apply 2 sprays to each nostril as directed daily. Shake bottle gently before using.   ? guaiFENesin (ROBITUSSIN) 100 mg/5 mL oral solution Take 10 mL by mouth every 4 hours as needed.   ? ibuprofen (MOTRIN) 400 mg tablet Take 400 mg by mouth every 6 hours as needed for Pain. Take with food.   ? levothyroxine (SYNTHROID) 100 mcg tablet Take 100 mcg by mouth daily 30 minutes before breakfast. Indications: BRAND NAME ONLY   ? lidocaine (ASPERCREME PATCH) 4 % topical patch Apply 1 patch topically to affected area daily.   ? loratadine (CLARITIN) 10 mg tablet Take 10 mg by mouth every morning.   ? LORazepam (ATIVAN) 0.5 mg tablet Take 0.5 mg by mouth at bedtime daily.   ? melatonin 3 mg tab Take 5 mg by mouth at bedtime daily. Takes 5 mg at hs.   ? mirabegron ER (MYRBETRIQ) 25 mg tablet Take one tablet by mouth daily. (Patient taking differently: Take 50 mg by mouth daily. Increased to 50 on 03/12/2020)   ? omeprazole DR(+) (PRILOSEC) 40 mg capsule Take 40 mg by mouth daily before breakfast.   ? ondansetron (ZOFRAN) 4 mg tablet Take 4 mg by mouth every 12 hours as needed for Nausea or Vomiting.   ? PARoxetine (PAXIL) 20 mg tablet Take 20 mg by mouth daily.   ? pimavanserin (NUPLAZID) 34 mg capsule Take one capsule by mouth daily.   ? QUEtiapine (SEROQUEL) 25 mg tablet Take two tablets by mouth at bedtime daily.     Vitals:    06/09/20 1525 BP: (!) 187/89   BP Source: Arm, Right Upper   Patient Position: Sitting   Pulse: 74   Weight: 63.5 kg (140 lb)   Height: 167.6 cm (66)   PainSc: Zero     Body mass index is 22.6 kg/m?Marland Kitchen     Physical Exam  Vitals reviewed.   Constitutional:       General: She is not in acute distress.     Appearance: Normal appearance. She is well-developed and normal weight. She is not ill-appearing.      Comments: Ambulates with walker  General changes consistent with history of Parkinson's disease   HENT:      Head: Normocephalic and atraumatic.   Eyes:      Extraocular Movements: Extraocular movements intact.   Pulmonary:      Effort: Pulmonary effort is normal. No respiratory distress.   Musculoskeletal:      Cervical back: Normal range of motion.   Skin:     General: Skin is warm and dry.   Neurological:      Mental Status: She is alert and oriented to person, place, and time. Mental status is at baseline.   Psychiatric:         Mood and Affect: Mood normal.  Behavior: Behavior normal.         Thought Content: Thought content normal.         Judgment: Judgment normal.              Assessment and Plan:  Ms. Lisa presents to clinic for follow-up as outlined. History of neurogenic bladder with urinary urgency, frequency, and urge incontinence. She is quite bothered by her symptoms. She has tried [employer] and does not think that is really helping enough. We discussed several options including possibly adding a second medication. She did not really want to do that. There are potential side effects with that which could be problematic in conjunction with her Parkinson's disease. We discussed the option of a catheter including either a Foley catheter today or in the near future versus a possible suprapubic tube. She is not really interested in that at this time but wants to discuss it with her family. She was given information about suprapubic tube to take home to review. We discussed the possibility of Botox and she is really not interested in that either. We also reviewed the option of a urodynamic study but they would like to hold off on that at this time. She will be scheduled to return to clinic in about 6 months or sooner for problems. They were advised to contact our office should they wish to do something else in the interim.    History and examination reviewed and key elements confirmed.  Discussed with the patient in detail and questions answered.    I personally saw and evaluated the patient and determined the plan of care.    Honor Frison L. Littie Deeds, MD, MPH    Total visit time > 30 minutes including review of records, history, exam, counseling, coordination of care, and documentation.  Coding also based on visit complexity.

## 2020-06-09 NOTE — Progress Notes
Attempted to call Alexis Matthews Plaza/caregiver to verify compliance and assess tolerance of her specialty medication, Nuplazid. Patient stated this was not a good time to talk and asked for a call back tomorrow.    Alvin Critchley, PharmD, BCPS  Neurology Clinic Pharmacist

## 2020-06-10 ENCOUNTER — Encounter: Admit: 2020-06-10 | Discharge: 2020-06-10 | Payer: MEDICARE

## 2020-06-10 NOTE — Progress Notes
Attempted to call Alexis Matthews/caregiver to verify compliance and assess tolerance of her specialty medication, Nuplazid. No answer. Left voicemail asking patient/caregiver to return call to neurology pharmacist at [phone].    Alvin Critchley, PharmD, BCPS  Neurology Clinic Pharmacist

## 2020-06-13 ENCOUNTER — Encounter: Admit: 2020-06-13 | Discharge: 2020-06-13 | Payer: MEDICARE

## 2020-06-13 MED ORDER — NUPLAZID 34 MG PO CAP
34 mg | ORAL_CAPSULE | Freq: Every day | ORAL | 5 refills | Status: CN
Start: 2020-06-13 — End: ?

## 2020-06-13 NOTE — Progress Notes
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 The regimen of 34 mg by mouth once daily is planned to continue indefinitely which is appropriate for Alexis Matthews. No renal or hepatic adjustments are required.  . At this time there is no planned dose titration.     Hallucinations/PD Psychosis Reassessment: (Patient or Caregiver/proxy)  ? Over the past week have you seen, heard, smelled or felt things that were not there?: 2: Mild: Formed hallucinations independent of environmental stimuli. No loss of insight. Patient reports seeing people, animals, shapes, etc. a few times per week.    ? How frequently do you see or hear things (visual and/or auditory hallucinations) that are not there?: 3.Frequently (more than 3 times per week, 50% of days)    ? Can you tell that these things (hallucinations, delusions) are not real?: Yes    ? When you have the hallucinations, do the hallucinations threaten you in any way? This means the actions against you, not just the fact they are there.: 1. Never    Therapeutic Goals:  -Initial reduction in severity and/or frequency of hallucinations and/or delusions after at least 8 weeks of therapy: Patient believes Nuplazid (in conjunction with quetiapine 50mg ) has helped decreased frequency/severity of hallucinations.  As the patient is achieving therapeutic benefit from the therapy, the plan is to continue current therapy.      Labs and Diagnostic tests:  Labs:  Lab Results   Component Value Date/Time    NA 140 01/19/2020 05:33 AM    K 3.8 01/19/2020 05:33 AM    CL 107 01/19/2020 05:33 AM    CO2 25 01/19/2020 05:33 AM    GAP 8 01/19/2020 05:33 AM    BUN 18 01/19/2020 05:33 AM    CR 0.9 01/19/2020 05:36 AM    CR 0.97 01/19/2020 05:33 AM    GLU 100 01/19/2020 05:33 AM    CA 9.7 01/19/2020 05:33 AM    MG 2.2 01/19/2020 05:33 AM    ALBUMIN 4.1 01/19/2020 05:33 AM    TOTPROT 6.8 01/19/2020 05:33 AM    ALKPHOS 68 01/19/2020 05:33 AM    AST 12 01/19/2020 05:33 AM    ALT 8 01/19/2020 05:33 AM    TOTBILI 0.4 01/19/2020 05:33 AM    GFR 56 (L) 01/19/2020 05:33 AM    GFRAA >60 01/19/2020 05:33 AM       Baseline ECG  ECG on?07/03/2018??with QTc interval of 440?msec.     Allergies:   Allergies   Allergen Reactions   ? Latex SWELLING   ? Primidone MENTAL STATUS CHANGES     Does not like the feeling it gives her feels like I'm outside my body.   ? Celexa [Citalopram] STOMACH UPSET   ? Flu Vaccine [Influenza Virus Vaccines] NAUSEA AND VOMITING   ? Lisinopril SEE COMMENTS     Hypotension and stomach cramps   ? Neurontin [Gabapentin] DIZZINESS     Falls          Immunizations:    There is no immunization history on file for this patient.  Vaccination history was reviewed. The patient was reminded about the importance of receiving an annual influenza vaccine as indicated.       Medication Reconciliation:  A medication history and reconciliation were performed (including prescription medications, supplements, over the counter, and herbal  products). The medication list was updated and the patient's current medication list is included below.     Home Medications    Medication Sig   benzonatate (TESSALON PERLES) 100 mg capsule Take 100 mg by mouth every 8 hours.   cholecalciferol (VITAMIN D-3) 400 unit tab tablet Take 1,000 Units by mouth daily.   cyanocobalamin (RUBRAMIN) 1,000 mcg/mL injection Inject 1 mL into the muscle every 30 days.   duloxetine DR (CYMBALTA) 60 mg capsule TAKE 1 CAPSULE BY MOUTH EVERY DAY   famotidine (PEPCID) 20 mg tablet Take 40 mg by mouth at bedtime daily.   fluticasone propionate (FLONASE) 50 mcg/actuation nasal spray, suspension Apply 2 sprays to each nostril as directed daily. Shake bottle gently before using.   guaiFENesin (ROBITUSSIN) 100 mg/5 mL oral solution Take 10 mL by mouth every 4 hours as needed.   ibuprofen (MOTRIN) 400 mg tablet Take 400 mg by mouth every 6 hours as needed for Pain. Take with food.   levothyroxine (SYNTHROID) 100 mcg tablet Take 100 mcg by mouth daily 30 minutes before breakfast. Indications: BRAND NAME ONLY   lidocaine (ASPERCREME PATCH) 4 % topical patch Apply 1 patch topically to affected area daily.   loratadine (CLARITIN) 10 mg tablet Take 10 mg by mouth every morning.   LORazepam (ATIVAN) 0.5 mg tablet Take 0.5 mg by mouth at bedtime daily.   melatonin 3 mg tab Take 5 mg by mouth at bedtime daily. Takes 5 mg at hs.   mirabegron ER (MYRBETRIQ) 25 mg tablet Take one tablet by mouth daily.  Patient taking differently: Take 50 mg by mouth daily. Increased to 50 on 03/12/2020   omeprazole DR(+) (PRILOSEC) 40 mg capsule Take 40 mg by mouth daily before breakfast.   ondansetron (ZOFRAN) 4 mg tablet Take 4 mg by mouth every 12 hours as needed for Nausea or Vomiting.   PARoxetine (PAXIL) 20 mg tablet Take 20 mg by mouth daily.   pimavanserin (NUPLAZID) 34 mg capsule Take one capsule by mouth daily.   QUEtiapine (SEROQUEL) 25 mg tablet Take two tablets by mouth at bedtime daily.       Drug-drug and drug-food interactions between the patients' specialty medication and their medication list were assessed and reviewed with the patient. The patient was instructed to speak with their health care provider before starting any new drugs, including prescription or over the counter, natural / herbal products, or vitamins.    Drug Interactions:  No new significant drug-drug or drug-food interactions were identified.    Drug-Food Interactions  Patient should limit/avoid frequent use of grapefruit. This medication can be taken with or without food.    Adverse effects:  Alexis Matthews is not experiencing any significant adverse effects to this medication regimen.    Adherence:  Refill and adherence history were reviewed with the patient. The patient is adherent with refills and is meeting their refill goal. They report no missed doses over the past 30 days.  The patient is meeting their adherence goal. The patient was reminded about the refill process and re-educated on the importance of adherence.  Overall medication adherence status will be discussed with the care team as deemed appropriate.    Safety Precautions:  Contraindications: Alexis Matthews has no contraindications to this medication.  Risk Evaluation and Mitigation Strategy (REMS) Assessment:  No REMS is required for this medication.   Pregnancy status: Pregnancy status was assessed and determined to be: Female, not of child-bearing potential: education not applicable. Marland Kitchen  Follow Up Plan:   Alexis Matthews/Caregiver was given the opportunity to ask questions and did not have any questions at this time. The patient was reminded of the refill process as discussed during the medication education.  Alexis Matthews/Caregiver was encouraged to call the Neurology pharmacist at [phone] with questions. The patient will be contacted to complete a medication reassessment within 1 year.    Alvin Critchley, PHARMD, BCPS  [facility]  Neurology Department   Phone Number: [phone]  Email: [email or web address]

## 2020-06-23 ENCOUNTER — Encounter: Admit: 2020-06-23 | Discharge: 2020-06-23 | Payer: MEDICARE

## 2020-06-27 ENCOUNTER — Encounter: Admit: 2020-06-27 | Discharge: 2020-06-27 | Payer: MEDICARE

## 2020-06-27 MED FILL — NUPLAZID 34 MG PO CAP: 34 mg | ORAL | 30 days supply | Qty: 30 | Fill #1 | Status: AC

## 2020-08-04 ENCOUNTER — Encounter: Admit: 2020-08-04 | Discharge: 2020-08-04 | Payer: MEDICARE

## 2020-08-04 MED FILL — NUPLAZID 34 MG PO CAP: 34 mg | ORAL | 30 days supply | Qty: 30 | Fill #2 | Status: AC

## 2020-08-31 ENCOUNTER — Encounter: Admit: 2020-08-31 | Discharge: 2020-08-31 | Payer: MEDICARE

## 2020-08-31 MED FILL — NUPLAZID 34 MG PO CAP: 34 mg | ORAL | 30 days supply | Qty: 30 | Fill #3 | Status: AC

## 2020-09-30 ENCOUNTER — Encounter: Admit: 2020-09-30 | Discharge: 2020-09-30 | Payer: MEDICARE

## 2020-10-01 ENCOUNTER — Encounter: Admit: 2020-10-01 | Discharge: 2020-10-01 | Payer: MEDICARE

## 2020-10-03 MED FILL — NUPLAZID 34 MG PO CAP: 34 mg | ORAL | 30 days supply | Qty: 30 | Fill #4 | Status: AC

## 2020-10-05 ENCOUNTER — Encounter: Admit: 2020-10-05 | Discharge: 2020-10-05 | Payer: MEDICARE

## 2020-10-31 ENCOUNTER — Encounter: Admit: 2020-10-31 | Discharge: 2020-10-31 | Payer: MEDICARE

## 2020-10-31 MED FILL — NUPLAZID 34 MG PO CAP: 34 mg | ORAL | 30 days supply | Qty: 30 | Fill #5 | Status: AC

## 2020-11-30 ENCOUNTER — Encounter: Admit: 2020-11-30 | Discharge: 2020-11-30 | Payer: MEDICARE

## 2020-11-30 MED FILL — NUPLAZID 34 MG PO CAP: 34 mg | ORAL | 30 days supply | Qty: 30 | Fill #6 | Status: AC

## 2020-12-07 ENCOUNTER — Encounter: Admit: 2020-12-07 | Discharge: 2020-12-07 | Payer: MEDICARE

## 2020-12-07 ENCOUNTER — Ambulatory Visit: Admit: 2020-12-07 | Discharge: 2020-12-07 | Payer: MEDICARE

## 2020-12-07 DIAGNOSIS — K219 Gastro-esophageal reflux disease without esophagitis: Secondary | ICD-10-CM

## 2020-12-07 DIAGNOSIS — F419 Anxiety disorder, unspecified: Secondary | ICD-10-CM

## 2020-12-07 DIAGNOSIS — M4800 Spinal stenosis, site unspecified: Secondary | ICD-10-CM

## 2020-12-07 DIAGNOSIS — R2689 Other abnormalities of gait and mobility: Secondary | ICD-10-CM

## 2020-12-07 DIAGNOSIS — G20 Parkinson's disease: Secondary | ICD-10-CM

## 2020-12-07 DIAGNOSIS — L84 Corns and callosities: Secondary | ICD-10-CM

## 2020-12-07 DIAGNOSIS — R002 Palpitations: Secondary | ICD-10-CM

## 2020-12-07 DIAGNOSIS — T50905A Adverse effect of unspecified drugs, medicaments and biological substances, initial encounter: Secondary | ICD-10-CM

## 2020-12-07 DIAGNOSIS — Z974 Presence of external hearing-aid: Secondary | ICD-10-CM

## 2020-12-07 DIAGNOSIS — C801 Malignant (primary) neoplasm, unspecified: Secondary | ICD-10-CM

## 2020-12-07 DIAGNOSIS — I10 Essential (primary) hypertension: Secondary | ICD-10-CM

## 2020-12-07 DIAGNOSIS — S72009A Fracture of unspecified part of neck of unspecified femur, initial encounter for closed fracture: Secondary | ICD-10-CM

## 2020-12-07 DIAGNOSIS — E049 Nontoxic goiter, unspecified: Secondary | ICD-10-CM

## 2020-12-07 DIAGNOSIS — R011 Cardiac murmur, unspecified: Secondary | ICD-10-CM

## 2020-12-07 MED ORDER — MYRBETRIQ 50 MG PO TB24
50 mg | ORAL_TABLET | Freq: Every day | ORAL | 3 refills | Status: AC
Start: 2020-12-07 — End: ?

## 2020-12-21 ENCOUNTER — Ambulatory Visit: Admit: 2020-12-21 | Discharge: 2020-12-21 | Payer: MEDICARE

## 2020-12-21 ENCOUNTER — Encounter: Admit: 2020-12-21 | Discharge: 2020-12-21 | Payer: MEDICARE

## 2020-12-21 DIAGNOSIS — M4800 Spinal stenosis, site unspecified: Secondary | ICD-10-CM

## 2020-12-21 DIAGNOSIS — R002 Palpitations: Secondary | ICD-10-CM

## 2020-12-21 DIAGNOSIS — S72009A Fracture of unspecified part of neck of unspecified femur, initial encounter for closed fracture: Secondary | ICD-10-CM

## 2020-12-21 DIAGNOSIS — F419 Anxiety disorder, unspecified: Secondary | ICD-10-CM

## 2020-12-21 DIAGNOSIS — G20 Parkinson's disease: Principal | ICD-10-CM

## 2020-12-21 DIAGNOSIS — T50905A Adverse effect of unspecified drugs, medicaments and biological substances, initial encounter: Secondary | ICD-10-CM

## 2020-12-21 DIAGNOSIS — K219 Gastro-esophageal reflux disease without esophagitis: Secondary | ICD-10-CM

## 2020-12-21 DIAGNOSIS — R011 Cardiac murmur, unspecified: Secondary | ICD-10-CM

## 2020-12-21 DIAGNOSIS — R2689 Other abnormalities of gait and mobility: Secondary | ICD-10-CM

## 2020-12-21 DIAGNOSIS — R443 Hallucinations, unspecified: Secondary | ICD-10-CM

## 2020-12-21 DIAGNOSIS — L84 Corns and callosities: Secondary | ICD-10-CM

## 2020-12-21 DIAGNOSIS — Z9181 History of falling: Secondary | ICD-10-CM

## 2020-12-21 DIAGNOSIS — E049 Nontoxic goiter, unspecified: Secondary | ICD-10-CM

## 2020-12-21 DIAGNOSIS — I10 Essential (primary) hypertension: Secondary | ICD-10-CM

## 2020-12-21 DIAGNOSIS — C801 Malignant (primary) neoplasm, unspecified: Secondary | ICD-10-CM

## 2020-12-21 DIAGNOSIS — Z974 Presence of external hearing-aid: Secondary | ICD-10-CM

## 2020-12-21 MED ORDER — QUETIAPINE 25 MG PO TAB
Freq: Every evening | 0 refills | Status: AC
Start: 2020-12-21 — End: ?

## 2020-12-21 MED ORDER — RIVASTIGMINE TARTRATE 1.5 MG PO CAP
1.5 mg | ORAL_CAPSULE | Freq: Two times a day (BID) | ORAL | 0 refills | 90.00000 days | Status: AC
Start: 2020-12-21 — End: ?

## 2020-12-21 NOTE — Assessment & Plan Note
Her symptoms are stable. No medication changes were recommended during the current  visit.

## 2020-12-21 NOTE — Progress Notes
Cognitive Evaluation Date: 12/21/2020     Reason for Evaluation:  Cognitive follow up    [facility] Scale:  Visuospatial/Executive Points: 2/5  Naming Points: 2/3  Attention Points: 4/6  Language Points: 0/3  Abstraction Points: 2/2  Delayed Recall Points: 3/5  Orientation Points: 4/6  Education <or= 12 Years: 0/1  MOCA Score (out of 30): 17     Geriatric Depression Scale:  10   suggesting no depression    Katz Index of Independent Living: Patient is independent     Bathing:   No  Dressing:  No    Toileting: No  Transfering: Yes  Continence: No  Feeding: Yes    Safety Assessment: Patent is     Driving:    No  Taking medication as prescribed: Assisted living provides them  Experiences unsteadiness or falls: Yes  Safety Concerns in the house:  No  Firearms in the house:   No  Lives alone:    No    Falls Risk Score:   8  High risk of falls     Timed Up and Go with Pushing: 32 Seconds (walker)     Impression:  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 She has difficulty with executive function, attention, language, recall and orientation. This is secondary to Parkinson's disease. Patient was started on Exelon (rivastigmine) for memory problems. She was started on Exelon (rivastigmine) 1.5 mg twice a day. Side effects of Exelon (rivastigmine) were discussed with the patient and she was given a list of common side effects. The patient will call us in 4 weeks for dose adjustments.      1. Feedback and Recommendations.  Feedback of the results of today's evaluation was given to the patient.      2. Daily Activities.  Minerva Areola her son monitors her activities of a medical (including medications), financial, and legal nature to reduce the likelihood of mistakes.    3. Driving.  She does not drive.    4. Depression/Anxiety. Patient does not have depression as evidenced by the Geriatric Depression Scale. She is presently on Cymbalta (duloxetine) and no medication changes were recommended during the current visit.         5. Other Safety Issues: She lives in assisted living.     6. Repeat Evaluation.  Repeat neurocognitive evaluation is recommended in 12 months in order to update her cognitive trajectory and treatment recommendations.

## 2020-12-21 NOTE — Assessment & Plan Note
Her symptoms are stable. No medication changes were recommended during the current  visit. IPG settings were not changed.

## 2020-12-21 NOTE — Progress Notes
Alexis Matthews is a 75 y.o. female.    Subjective:             History of Present Illness     Parkinson's Disease Follow-Up Visit    Since my last visit I am: Moderately worse    Symptoms Scale:    Memory problems: Mild, bothersome  Hallucinations/delusions: Slight, sense of presence  Depression: Slight, not bothersome  Anxiety: None  Apathy: None  Impulsive behavior: None  Nighttime sleep: Slight, slight difficulty falling asleep  Daytime sleepiness: Slight, occasionally  Vivid dreams: Moderate, disturbing  REM sleep behavior disorder: None  Restless leg syndrome: Mild, occasionally, mainly on prolonged seating  Pain or muscle cramps: Moderate, back pain limits activities  Urination: No problems  Constipation: None  Dizziness or lightheadedness: Slight, occasional on standing  Tiredness/Fatigue: Slight, reduced stamina  Falling: Slight, rarely during the year  Personal care assistance: Moderate, I have some difficulty and need some help daily    Total Score:  Total Score: 20    Assistive Devices:  Assistive devices for getting around: Walker    OFF Time:    OFF time: No    Dyskinesia:    Dyskinesia while awake: No    Employment Status:    Employment: Retired - not due to PD          Review of Systems   Gastrointestinal: Positive for constipation. Negative for abdominal distention, abdominal pain, anal bleeding, blood in stool, diarrhea, nausea, rectal pain and vomiting.   Genitourinary: Positive for enuresis.   Neurological: Positive for speech difficulty and light-headedness.         Objective:         ? acetaminophen (TYLENOL) 325 mg tablet Take 650 mg three times day.   ? amLODIPine (NORVASC) 5 mg tablet Take 5 mg by mouth daily.   ? benzonatate (TESSALON PERLES) 100 mg capsule Take 100 mg by mouth every 8 hours.   ? calcium polycarbophil (FIBERCON PO) Take  by mouth. Once every other day .   ? cholecalciferol (VITAMIN D-3) 400 unit tab tablet Take 2,000 Units by mouth daily.   ? cyanocobalamin (RUBRAMIN) 1,000 mcg/mL injection Inject 1 mL into the muscle every 30 days.   ? duloxetine DR (CYMBALTA) 60 mg capsule TAKE 1 CAPSULE BY MOUTH EVERY DAY   ? famotidine (PEPCID) 20 mg tablet Take 40 mg by mouth at bedtime daily.   ? guaiFENesin (ROBITUSSIN) 100 mg/5 mL oral solution Take 10 mL by mouth every 4 hours as needed.   ? ibuprofen (MOTRIN) 400 mg tablet Take 400 mg by mouth every 6 hours as needed for Pain. Take with food.   ? levothyroxine (SYNTHROID) 100 mcg tablet Take 112 mcg by mouth daily 30 minutes before breakfast. Indications: BRAND NAME ONLY   ? lidocaine (ASPERCREME PATCH) 4 % topical patch Apply 1 patch topically to affected area daily.   ? loratadine (CLARITIN) 10 mg tablet Take 10 mg by mouth every morning.   ? LORazepam (ATIVAN) 0.5 mg tablet Take 0.5 mg by mouth at bedtime daily.   ? melatonin 3 mg tab Take 5 mg by mouth at bedtime daily. Takes 5 mg at hs.   ? mirabegron (MYRBETRIQ) 50 mg ER tablet Take one tablet by mouth daily.   ? omeprazole DR(+) (PRILOSEC) 40 mg capsule Take 40 mg by mouth daily before breakfast.   ? ondansetron (ZOFRAN) 4 mg tablet Take 4 mg by mouth every 12 hours as needed for Nausea  or Vomiting.   ? PARoxetine (PAXIL) 20 mg tablet Take 20 mg by mouth daily.   ? pimavanserin (NUPLAZID) 34 mg capsule Take one capsule by mouth daily.   ? QUEtiapine (SEROQUEL) 25 mg tablet Take two tablets by mouth at bedtime daily. (Patient taking differently: Take 50 mg by mouth at bedtime daily. Taking one in the am and two at bedtime.)   ? rivastigmine tartrate (EXELON) 1.5 mg capsule Take one capsule by mouth twice daily.     Vitals:    12/21/20 1315 12/21/20 1330   BP: (!) 139/92 108/68   BP Source: Arm, Left Upper Arm, Left Upper   Patient Position: Sitting Standing   Pulse: 87 95   Weight: 71.7 kg (158 lb)    Height: 167.6 cm (5' 6)    PainSc: Zero      Body mass index is 25.5 kg/m?Marland Kitchen     Physical Exam    EXAMINATION:     ORIENTATION: Alert and Oriented.    Cranial Nerves:  reduced facial expression and soft voice.        Right Left   Bradkinesia  Mild Mild   Tremor Hands Resting None None    Postural None None    Kinetic None None           Tremor Other: None  Dyskinesia: None  Muscle Strength: Unremarkable  Gait: Needs assistance to walk    Unified Parkinson's Disease Rating Scale: OFF  Total Mentation Score: 4  Total Activities of Daily Living Score: 16  Total Motor Exam: 35  Total UPDRS Score: 55    Hoehn & Yahr Staging: 4    Schwab & [place] Staging: 45 %    Geriatric Depression Scale: 10 suggesting no depression.    Epworth Sleepiness Scale: 8 suggesting daytime drowsiness.    PDQ-39 suggests that his symptoms impact his quality of life by:  PDQ 39 IMPACT  Mobility Percent: 72.5 %  ADL Percent: 29.17 %  Emotional Percent: 45.83 %  Social Stigma Percent: 31.25 %  Social Support Percent: 50 %  Cognition Percent: 25 %  Communication Percent: 41.67 %  Body Discomfort Percent: 50 %  PDQ Total Percent: 46.79 %              Assessment and Plan:    Problem   Parkinson's disease with Bilateral STN Implants    Symptoms began in 2001, initial symptoms was right hand tremor. Diagnosed with Parkinson's disease in 2001. Atypical PD Features: None  Patient has difficulty tolerating Sinemet (carbidopa/levodopa) and dopamine agonists.   09/29/08 UPDRS Motor OFF 36  Underwent bilateral Subthalamic stimulator implants in 2010 for bothersome tremor with marked improvement.   03/24/2012 Mentation Score: 2 Activities of Daily Living Score: 4 Motor Exam:19 Total UPDRS Score: 25    PDQ 39 IMPACT PDQ Total Percent: 6.41 %  04/30/2013  Mentation Score: 3 Activities of Daily Living Score: 13 Motor Exam: 28 Total UPDRS Score: 44   PDQ 39 IMPACT PDQ Total Percent: 5.77 %   05/06/2014 Total Mentation Score: 5 Total Activities of Daily Living Score: 7 Total Motor Exam: 18 Total UPDRS Score: 30  PDQ Total Percent: 17.95 %  05/27/2015 Total Mentation Score: 4 Total Activities of Daily Living Score: 16 Total Motor Exam: 34 Total UPDRS Score: 54   PDQ Total Percent: 33.33 %   Patient was evaluated by Occupational Therapist, Kelli Reiling.    Nausea and fogginess with Sinemet (carbidopa/levodopa) 25/100   09/10/2017 PDQ Total Percent:  35.26 %   Total Mentation Score: 4 Total Activities of Daily Living Score: 15 Total Motor Exam: 29 Total UPDRS Score: 48   01/13/2018 Side effects to Mirapex (pramipexole)   06/11/2018 PDQ8 Total %: 34   Rytary (carbidopa/levodopa) extended release capsules causing side effects  03/03/2019 PDQ Total Percent: 33.97 %   Total Mentation Score: 5 Total Activities of Daily Living Score: 21 Total Motor Exam: 32 Total UPDRS Score: 58   01/05/2020 PDQ8 Total %: 34   12/21/2020 Total Mentation Score: 4 Total Activities of Daily Living Score: 16  Total Motor Exam: 35 Total UPDRS Score: 55 PDQ Total Percent: 46.79 %      PD Dementia    12/21/2020 MOCA Score (out of 30): 17      Hallucination    04/15/2019 On Nuplazid (pimavanserin): worse when discontinued  06/29/2019 On Seroquel (quetiapine)       At Risk for Falls    04/15/2019 Falls Risk Score:   13 Timed Up and Go with Pushing: 26 Seconds(walker)     06/29/2019 Timed Up and Go with Pushing: 17 Seconds(walker)   01/05/2020 Falls Risk Score:   10 Timed Up and Go with Pushing: 28 Seconds([employer] walker)     12/21/2020 Falls Risk Score:   8 Timed Up and Go with Pushing: 32 Seconds (walker)            Parkinson's disease with Bilateral STN Implants  Her symptoms are stable. No medication changes were recommended during the current  visit. IPG settings were not changed.      Hallucination  Her symptoms are stable. No medication changes were recommended during the current  visit.      Patient will follow up in approximately 6 months.

## 2020-12-26 ENCOUNTER — Encounter: Admit: 2020-12-26 | Discharge: 2020-12-26 | Payer: MEDICARE

## 2020-12-27 ENCOUNTER — Encounter: Admit: 2020-12-27 | Discharge: 2020-12-27 | Payer: MEDICARE

## 2020-12-27 MED ORDER — NUPLAZID 34 MG PO CAP
34 mg | ORAL_CAPSULE | Freq: Every day | ORAL | 5 refills | Status: CN
Start: 2020-12-27 — End: ?

## 2020-12-27 NOTE — Progress Notes
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 The regimen of 34 mg by mouth once daily is planned to continue indefinitely which is appropriate for Alexis Matthews. No renal or hepatic adjustments are required.  . At this time there is no planned dose titration.     Hallucinations/PD Psychosis Reassessment: (Patient or Caregiver/proxy)  ? Over the past week have you seen, heard, smelled or felt things that were not there?: 0: Normal: No hallucinations or psychotic behavior.    ? How frequently do you see or hear things (visual and/or auditory hallucinations) that are not there?: 1.Rarely    ? Can you tell that these things (hallucinations, delusions) are not real?: 0. I am not having them now.    ? When you have the hallucinations, do the hallucinations threaten you in any way? This means the actions against you, not just the fact they are there.: 0. I don't have any hallucinations now    Therapeutic Goals:  -Initial reduction in severity and/or frequency of hallucinations and/or delusions after at least 8 weeks of therapy: Spouse reports patient's hallucinations are controlled with current regimen.  As the patient is achieving therapeutic benefit from the therapy, the plan is to continue current therapy.      Labs and Diagnostic tests:  Labs:  Lab Results   Component Value Date/Time    NA 140 01/19/2020 05:33 AM    K 3.8 01/19/2020 05:33 AM    CL 107 01/19/2020 05:33 AM    CO2 25 01/19/2020 05:33 AM    GAP 8 01/19/2020 05:33 AM    BUN 18 01/19/2020 05:33 AM    CR 0.9 01/19/2020 05:36 AM    CR 0.97 01/19/2020 05:33 AM    GLU 100 01/19/2020 05:33 AM    CA 9.7 01/19/2020 05:33 AM    MG 2.2 01/19/2020 05:33 AM    ALBUMIN 4.1 01/19/2020 05:33 AM    TOTPROT 6.8 01/19/2020 05:33 AM ALKPHOS 68 01/19/2020 05:33 AM    AST 12 01/19/2020 05:33 AM    ALT 8 01/19/2020 05:33 AM    TOTBILI 0.4 01/19/2020 05:33 AM    GFR 56 (L) 01/19/2020 05:33 AM    GFRAA >60 01/19/2020 05:33 AM       Baseline ECG  ECG on 07/03/2018  with QTc interval of 440 msec.     ECG (QTc interval) monitoring:   ECG on 01/19/20 with QTc interval of 338 msec. No routine monitoring planned. Monitor as clinically indicated. Of note, abnormal EKG, possible infarct    Allergies:   Allergies   Allergen Reactions   ? Latex SWELLING   ? Primidone MENTAL STATUS CHANGES     Does not like the feeling it gives her feels like I'm outside my body.   ? Celexa [Citalopram] STOMACH UPSET   ? Flu Vaccine [Influenza Virus Vaccines] NAUSEA AND VOMITING   ? Lisinopril SEE COMMENTS     Hypotension and stomach cramps   ? Neurontin [Gabapentin] DIZZINESS     Falls          Immunizations:    There is no immunization history on file for this patient.  Vaccination history was reviewed. The patient was reminded about the importance of receiving an annual influenza vaccine as indicated.       Medication Reconciliation:  A medication history  and reconciliation were performed (including prescription medications, supplements, over the counter, and herbal products). The medication list was updated and the patient's current medication list is included below.     Home Medications    Medication Sig   acetaminophen (TYLENOL) 325 mg tablet Take 650 mg three times day.   amLODIPine (NORVASC) 5 mg tablet Take 5 mg by mouth daily.   benzonatate (TESSALON PERLES) 100 mg capsule Take 100 mg by mouth every 8 hours.   calcium polycarbophil (FIBERCON PO) Take  by mouth. Once every other day .   cholecalciferol (VITAMIN D-3) 400 unit tab tablet Take 2,000 Units by mouth daily.   cyanocobalamin (RUBRAMIN) 1,000 mcg/mL injection Inject 1 mL into the muscle every 30 days.   duloxetine DR (CYMBALTA) 60 mg capsule TAKE 1 CAPSULE BY MOUTH EVERY DAY   famotidine (PEPCID) 20 mg tablet Take 40 mg by mouth at bedtime daily.   guaiFENesin (ROBITUSSIN) 100 mg/5 mL oral solution Take 10 mL by mouth every 4 hours as needed.   ibuprofen (MOTRIN) 400 mg tablet Take 400 mg by mouth every 6 hours as needed for Pain. Take with food.   levothyroxine (SYNTHROID) 100 mcg tablet Take 112 mcg by mouth daily 30 minutes before breakfast. Indications: BRAND NAME ONLY   lidocaine (ASPERCREME PATCH) 4 % topical patch Apply 1 patch topically to affected area daily.   loratadine (CLARITIN) 10 mg tablet Take 10 mg by mouth every morning.   LORazepam (ATIVAN) 0.5 mg tablet Take 0.5 mg by mouth at bedtime daily.   melatonin 3 mg tab Take 5 mg by mouth at bedtime daily. Takes 5 mg at hs.   mirabegron (MYRBETRIQ) 50 mg ER tablet Take one tablet by mouth daily.   omeprazole DR(+) (PRILOSEC) 40 mg capsule Take 40 mg by mouth daily before breakfast.   ondansetron (ZOFRAN) 4 mg tablet Take 4 mg by mouth every 12 hours as needed for Nausea or Vomiting.   PARoxetine (PAXIL) 20 mg tablet Take 20 mg by mouth daily.   pimavanserin (NUPLAZID) 34 mg capsule Take one capsule by mouth daily.   QUEtiapine (SEROQUEL) 25 mg tablet Taking one in the am and two at bedtime.   rivastigmine tartrate (EXELON) 1.5 mg capsule Take one capsule by mouth twice daily.       Drug-drug and drug-food interactions between the patients' specialty medication and their medication list were assessed and reviewed with the patient. The patient was instructed to speak with their health care provider before starting any new drugs, including prescription or over the counter, natural / herbal products, or vitamins.    Drug Interactions:  The following drug-drug interactions were identified: quetiapine and pimavanserin and they will be managed by EKGs as needed.     Drug-Food Interactions  Patient should limit/avoid frequent use of grapefruit. This medication can be taken with or without food.    Adverse effects:  Alexis Matthews is not experiencing any significant adverse effects to this medication regimen.    Adherence:  Refill and adherence history were reviewed with the patient. The patient is adherent with refills and is meeting their refill goal. They report no missed doses over the past 30 days.  The patient is meeting their adherence goal. The patient was reminded about the refill process and re-educated on the importance of adherence.  Overall medication adherence status will be discussed with the care team as deemed appropriate.    Safety Precautions:  Contraindications: NATAYSIA FELICIANO has no contraindications  to this medication.  Risk Evaluation and Mitigation Strategy (REMS) Assessment:  No REMS is required for this medication.   Pregnancy status: Pregnancy status was assessed and determined to be: Female, not of child-bearing potential: education not applicable. .      Follow Up Plan:   Alexis Matthews/Caregiver was given the opportunity to ask questions and inquired about needing a refill of Nuplazid. Script out of refills, new script sent to [facility],. The patient was reminded of the refill process as discussed during the medication education.  Alexis Matthews/Caregiver was encouraged to call the Neurology pharmacist at [phone] with questions. The patient will be contacted to complete a medication reassessment within 1 year.    Alvin Critchley, PHARMD, BCPS  [facility]  Neurology Department   Phone Number: [phone]  Email: [email or web address]

## 2021-01-02 ENCOUNTER — Encounter: Admit: 2021-01-02 | Discharge: 2021-01-02 | Payer: MEDICARE

## 2021-01-02 DIAGNOSIS — S72009A Fracture of unspecified part of neck of unspecified femur, initial encounter for closed fracture: Secondary | ICD-10-CM

## 2021-01-02 DIAGNOSIS — M4800 Spinal stenosis, site unspecified: Secondary | ICD-10-CM

## 2021-01-02 DIAGNOSIS — Z974 Presence of external hearing-aid: Secondary | ICD-10-CM

## 2021-01-02 DIAGNOSIS — R002 Palpitations: Secondary | ICD-10-CM

## 2021-01-02 DIAGNOSIS — L84 Corns and callosities: Secondary | ICD-10-CM

## 2021-01-02 DIAGNOSIS — T50905A Adverse effect of unspecified drugs, medicaments and biological substances, initial encounter: Secondary | ICD-10-CM

## 2021-01-02 DIAGNOSIS — F419 Anxiety disorder, unspecified: Secondary | ICD-10-CM

## 2021-01-02 DIAGNOSIS — I10 Essential (primary) hypertension: Secondary | ICD-10-CM

## 2021-01-02 DIAGNOSIS — R011 Cardiac murmur, unspecified: Secondary | ICD-10-CM

## 2021-01-02 DIAGNOSIS — E049 Nontoxic goiter, unspecified: Secondary | ICD-10-CM

## 2021-01-02 DIAGNOSIS — K219 Gastro-esophageal reflux disease without esophagitis: Secondary | ICD-10-CM

## 2021-01-02 DIAGNOSIS — R2689 Other abnormalities of gait and mobility: Secondary | ICD-10-CM

## 2021-01-02 DIAGNOSIS — C801 Malignant (primary) neoplasm, unspecified: Secondary | ICD-10-CM

## 2021-01-02 DIAGNOSIS — G20 Parkinson's disease: Secondary | ICD-10-CM

## 2021-01-09 ENCOUNTER — Encounter: Admit: 2021-01-09 | Discharge: 2021-01-09 | Payer: MEDICARE

## 2021-01-09 MED FILL — NUPLAZID 34 MG PO CAP: 34 mg | ORAL | 30 days supply | Qty: 30 | Fill #1 | Status: AC

## 2021-01-20 ENCOUNTER — Emergency Department: Admit: 2021-01-20 | Discharge: 2021-01-20 | Payer: MEDICARE

## 2021-01-20 ENCOUNTER — Encounter: Admit: 2021-01-20 | Discharge: 2021-01-20 | Payer: MEDICARE

## 2021-01-20 DIAGNOSIS — W19XXXA Unspecified fall, initial encounter: Secondary | ICD-10-CM

## 2021-01-20 LAB — CBC AND DIFF
ABSOLUTE BASO COUNT: 0 K/UL (ref 0–0.20)
ABSOLUTE EOS COUNT: 0.1 K/UL (ref 0–0.45)
ABSOLUTE MONO COUNT: 0.6 K/UL (ref 0–0.80)
MDW (MONOCYTE DISTRIBUTION WIDTH): 19 (ref <20.7)
WBC COUNT: 6.4 K/UL (ref 4.5–11.0)

## 2021-01-20 LAB — TSH WITH FREE T4 REFLEX: TSH: 0.5 uU/mL — ABNORMAL LOW (ref 0.35–5.00)

## 2021-01-20 LAB — URINALYSIS MICROSCOPIC REFLEX TO CULTURE

## 2021-01-20 LAB — COMPREHENSIVE METABOLIC PANEL
EGFR: >60 mL/min (ref >60)
SODIUM: 142 MMOL/L (ref 137–147)

## 2021-01-20 LAB — URINALYSIS DIPSTICK REFLEX TO CULTURE
LEUKOCYTES: NEGATIVE U/L (ref 7–56)
NITRITE: NEGATIVE MMOL/L (ref 21–30)
URINE ASCORBIC ACID, UA: NEGATIVE K/UL (ref 3–12)
URINE BILE: NEGATIVE g/dL (ref 3.5–5.0)
URINE BLOOD: NEGATIVE U/L — ABNORMAL LOW (ref 25–110)
URINE KETONE: NEGATIVE mg/dL (ref 0.3–1.2)

## 2021-01-20 LAB — POC TROPONIN: TROPONIN I, POC: 0 ng/mL (ref 0.00–0.05)

## 2021-01-20 NOTE — ED Notes
Pt presents to ED43 s/p a fall. Per EMS, pt had an unwitnessed fall at her assisted living facility and husband heard patient, went to her assistance and pt was awake on the floor. Pt did not want to be evaluated earlier, but per facility pt seems to be more lethargic. No evidence of blood thinner usage, pt GCS of 14 which is reportedly baseline. Pt denies any pain, but upon palpation, states her left humerus hurts. Monitors applied and vSS on RA. Bed in the lowest locked position and call light within reach.     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

## 2021-01-20 NOTE — ED Notes
Sophia, LPN, called to tell this RN that Alexis Matthews had a fall at approximately 0630 this morning and denied any intervention at the time. This afternoon pt became nauseous and caretaker felt that pt was sleeping more than usual. Per Sophia, pt is confused at baseline, knows self/situation but is often confused on time. Sophia leaves contact number for any further questions: [phone]

## 2021-01-20 NOTE — ED Notes
DC instructions reviewed with pt and pt verbalizes understanding. PIV removed and pt discharged via [facility] with her son.

## 2021-02-03 ENCOUNTER — Encounter: Admit: 2021-02-03 | Discharge: 2021-02-03 | Payer: MEDICARE

## 2021-02-03 MED FILL — NUPLAZID 34 MG PO CAP: 34 mg | ORAL | 30 days supply | Qty: 30 | Fill #2 | Status: AC

## 2021-03-03 ENCOUNTER — Encounter: Admit: 2021-03-03 | Discharge: 2021-03-03 | Payer: MEDICARE

## 2021-03-03 MED FILL — NUPLAZID 34 MG PO CAP: 34 mg | ORAL | 30 days supply | Qty: 30 | Fill #3 | Status: AC

## 2021-03-31 ENCOUNTER — Encounter: Admit: 2021-03-31 | Discharge: 2021-03-31 | Payer: MEDICARE

## 2021-03-31 MED FILL — NUPLAZID 34 MG PO CAP: 34 mg | ORAL | 30 days supply | Qty: 30 | Fill #4 | Status: AC

## 2021-05-04 ENCOUNTER — Encounter: Admit: 2021-05-04 | Discharge: 2021-05-04 | Payer: MEDICARE

## 2021-05-04 MED FILL — NUPLAZID 34 MG PO CAP: 34 mg | ORAL | 30 days supply | Qty: 30 | Fill #5 | Status: AC

## 2021-06-02 ENCOUNTER — Encounter: Admit: 2021-06-02 | Discharge: 2021-06-02 | Payer: MEDICARE

## 2021-06-04 MED FILL — NUPLAZID 34 MG PO CAP: 34 mg | ORAL | 30 days supply | Qty: 30 | Fill #6 | Status: AC

## 2021-06-27 ENCOUNTER — Encounter: Admit: 2021-06-27 | Discharge: 2021-06-27 | Payer: MEDICARE

## 2021-06-27 MED ORDER — NUPLAZID 34 MG PO CAP
34 mg | ORAL_CAPSULE | Freq: Every day | ORAL | 5 refills | Status: CN
Start: 2021-06-27 — End: ?

## 2021-06-27 NOTE — Telephone Encounter
Refill request received for Nuplazid 34mg  capsule. LOV 12/21/20. NOV 07/04/21. Refill RX sent to requesting pharmacy per plan of care.

## 2021-06-29 ENCOUNTER — Encounter: Admit: 2021-06-29 | Discharge: 2021-06-29 | Payer: MEDICARE

## 2021-06-29 MED FILL — NUPLAZID 34 MG PO CAP: 34 mg | ORAL | 30 days supply | Qty: 30 | Fill #1 | Status: AC

## 2021-07-04 ENCOUNTER — Ambulatory Visit: Admit: 2021-07-04 | Discharge: 2021-07-04 | Payer: MEDICARE

## 2021-07-04 ENCOUNTER — Encounter: Admit: 2021-07-04 | Discharge: 2021-07-04 | Payer: MEDICARE

## 2021-07-04 DIAGNOSIS — M4800 Spinal stenosis, site unspecified: Secondary | ICD-10-CM

## 2021-07-04 DIAGNOSIS — C801 Malignant (primary) neoplasm, unspecified: Secondary | ICD-10-CM

## 2021-07-04 DIAGNOSIS — R002 Palpitations: Secondary | ICD-10-CM

## 2021-07-04 DIAGNOSIS — K219 Gastro-esophageal reflux disease without esophagitis: Secondary | ICD-10-CM

## 2021-07-04 DIAGNOSIS — R443 Hallucinations, unspecified: Secondary | ICD-10-CM

## 2021-07-04 DIAGNOSIS — R011 Cardiac murmur, unspecified: Secondary | ICD-10-CM

## 2021-07-04 DIAGNOSIS — I10 Essential (primary) hypertension: Secondary | ICD-10-CM

## 2021-07-04 DIAGNOSIS — Z974 Presence of external hearing-aid: Secondary | ICD-10-CM

## 2021-07-04 DIAGNOSIS — T50905A Adverse effect of unspecified drugs, medicaments and biological substances, initial encounter: Secondary | ICD-10-CM

## 2021-07-04 DIAGNOSIS — G20 Parkinson's disease: Secondary | ICD-10-CM

## 2021-07-04 DIAGNOSIS — F419 Anxiety disorder, unspecified: Secondary | ICD-10-CM

## 2021-07-04 DIAGNOSIS — R2689 Other abnormalities of gait and mobility: Secondary | ICD-10-CM

## 2021-07-04 DIAGNOSIS — F3289 Other specified depressive episodes: Secondary | ICD-10-CM

## 2021-07-04 DIAGNOSIS — S72009A Fracture of unspecified part of neck of unspecified femur, initial encounter for closed fracture: Secondary | ICD-10-CM

## 2021-07-04 DIAGNOSIS — E049 Nontoxic goiter, unspecified: Secondary | ICD-10-CM

## 2021-07-04 DIAGNOSIS — L84 Corns and callosities: Secondary | ICD-10-CM

## 2021-07-04 MED ORDER — RIVASTIGMINE TARTRATE 3 MG PO CAP
3 mg | ORAL_CAPSULE | Freq: Two times a day (BID) | ORAL | 5 refills | 90.00000 days | Status: AC
Start: 2021-07-04 — End: ?

## 2021-07-04 NOTE — Assessment & Plan Note
I recommended increasing Exelon (rivastigmine) 3 mg twice a day.

## 2021-07-04 NOTE — Progress Notes
Alexis Matthews is a 75 y.o. female.    Subjective:             History of Present Illness      Parkinson's Disease Follow-Up Visit    Since my last visit I am: Slightly worse    Symptoms Scale:    Memory problems: Mild, bothersome  Hallucinations/delusions: Mild, illusions  Depression: Mild, occurs due to a reason  Anxiety: Slight, not bothersome  Apathy: Slight, some loss of interest  Impulsive behavior: None  Nighttime sleep: No difficulty  Daytime sleepiness: Slight, occasionally  Vivid dreams: Slight, occasional  REM sleep behavior disorder: Slight, rarely  Restless leg syndrome: None  Pain or muscle cramps: Slight, pain or cramps occasionally  Urination: Marked, frequent accidents  Constipation: Marked, need prescription medication  Dizziness or lightheadedness: Slight, occasional on standing  Tiredness/Fatigue: Slight, reduced stamina  Falling: Slight, rarely during the year  Personal care assistance: Moderate, I have some difficulty and need some help daily    Total Score:  Total Score: 26    Assistive Devices:  Assistive devices for getting around: Walker    OFF Time:    OFF time: No    Dyskinesia:    Dyskinesia while awake: No    Employment Status:    Employment: Retired - not due to PD          Review of Systems   HENT: Positive for trouble swallowing.    Gastrointestinal: Positive for nausea. Negative for abdominal distention, abdominal pain, anal bleeding, blood in stool, constipation, diarrhea, rectal pain and vomiting.   Genitourinary: Positive for enuresis and urgency. Negative for decreased urine volume, difficulty urinating, dyspareunia, dysuria, flank pain, frequency, genital sores, hematuria, menstrual problem, pelvic pain, vaginal bleeding, vaginal discharge and vaginal pain.   Neurological: Positive for speech difficulty.         Objective:         ? acetaminophen (TYLENOL) 325 mg tablet Take 650 mg three times day.   ? amLODIPine (NORVASC) 5 mg tablet Take 5 mg by mouth daily.   ? benzonatate (TESSALON PERLES) 100 mg capsule Take 100 mg by mouth every 8 hours.   ? calcium polycarbophil (FIBERCON PO) Take  by mouth. Once every other day .   ? cholecalciferol (VITAMIN D-3) 400 unit tab tablet Take 2,000 Units by mouth daily.   ? cyanocobalamin (RUBRAMIN) 1,000 mcg/mL injection Inject 1 mL into the muscle every 30 days.   ? duloxetine DR (CYMBALTA) 60 mg capsule TAKE 1 CAPSULE BY MOUTH EVERY DAY   ? famotidine (PEPCID) 20 mg tablet Take 40 mg by mouth at bedtime daily.   ? guaiFENesin (ROBITUSSIN) 100 mg/5 mL oral solution Take 10 mL by mouth every 4 hours as needed.   ? ibuprofen (MOTRIN) 400 mg tablet Take 400 mg by mouth every 6 hours as needed for Pain. Take with food.   ? levothyroxine (SYNTHROID) 100 mcg tablet Take 112 mcg by mouth daily 30 minutes before breakfast. Indications: BRAND NAME ONLY   ? loratadine (CLARITIN) 10 mg tablet Take 10 mg by mouth every morning.   ? LORazepam (ATIVAN) 0.5 mg tablet Take 0.5 mg by mouth at bedtime daily.   ? melatonin 3 mg tab Take 5 mg by mouth at bedtime daily. Takes 5 mg at hs.   ? mirabegron (MYRBETRIQ) 50 mg ER tablet Take one tablet by mouth daily.   ? omeprazole DR(+) (PRILOSEC) 40 mg capsule Take 40 mg by mouth daily before  breakfast.   ? ondansetron (ZOFRAN) 4 mg tablet Take 4 mg by mouth every 12 hours as needed for Nausea or Vomiting.   ? PARoxetine (PAXIL) 20 mg tablet Take 20 mg by mouth daily.   ? pimavanserin (NUPLAZID) 34 mg capsule Take one capsule by mouth daily.   ? QUEtiapine (SEROQUEL) 25 mg tablet Taking one in the am and two at bedtime.   ? rivastigmine tartrate (EXELON) 3 mg capsule Take one capsule by mouth twice daily.       Body mass index is 26.31 kg/m?Marland Kitchen     Physical Exam    EXAMINATION:     ORIENTATION: Alert and Oriented.    Cranial Nerves:  reduced facial expression and soft voice.        Right Left   Bradkinesia  Mild Mild   Tremor Hands Resting None None    Postural None None    Kinetic None None           Tremor Other: None  Dyskinesia: None  Muscle Strength: Unremarkable  Gait: Needs assistance to walk         PDQ8 - Quality of Life  Had difficulty getting around in public places?: Ocassionally   Had difficulty dressing?: Often   Felt depressed?: Often   Had problems with your close personal relationships?: Always     Had problems with your concentration, for example, when reading or watching TV?: Sometimes   Felt unable to communicate effectively?: Often   Had painful muscle cramps or spasms?: Sometimes   Felt embarrassed in public due to having Parkinson's disease?: Ocassionally              PDQ8 Total Score (32 Possible): 19  PDQ8 Total %: 59                    TUG: Cannot do      Falls Risk Score:   10  High risk of falls                          Assessment and Plan:    Problem   Parkinson's disease with Bilateral STN Implants    Symptoms began in 2001, initial symptoms was right hand tremor. Diagnosed with Parkinson's disease in 2001. Atypical PD Features: None  Patient has difficulty tolerating Sinemet (carbidopa/levodopa) and dopamine agonists.   09/29/08 UPDRS Motor OFF 36  Underwent bilateral Subthalamic stimulator implants in 2010 for bothersome tremor with marked improvement.   03/24/2012 Mentation Score: 2 Activities of Daily Living Score: 4 Motor Exam:19 Total UPDRS Score: 25    PDQ 39 IMPACT PDQ Total Percent: 6.41 %  04/30/2013  Mentation Score: 3 Activities of Daily Living Score: 13 Motor Exam: 28 Total UPDRS Score: 44   PDQ 39 IMPACT PDQ Total Percent: 5.77 %   05/06/2014 Total Mentation Score: 5 Total Activities of Daily Living Score: 7 Total Motor Exam: 18 Total UPDRS Score: 30  PDQ Total Percent: 17.95 %  05/27/2015 Total Mentation Score: 4 Total Activities of Daily Living Score: 16 Total Motor Exam: 34 Total UPDRS Score: 54   PDQ Total Percent: 33.33 %   Patient was evaluated by Occupational Therapist, Kelli Reiling.    Nausea and fogginess with Sinemet (carbidopa/levodopa) 25/100   09/10/2017 PDQ Total Percent: 35.26 %   Total Mentation Score: 4 Total Activities of Daily Living Score: 15 Total Motor Exam: 29 Total UPDRS Score: 48   01/13/2018 Side effects to  Mirapex (pramipexole)   Rytary (carbidopa/levodopa) extended release capsules causing side effects  03/03/2019 PDQ Total Percent: 33.97 %   Total Mentation Score: 5 Total Activities of Daily Living Score: 21 Total Motor Exam: 32 Total UPDRS Score: 58   01/05/2020 PDQ8 Total %: 34   12/21/2020 Total Mentation Score: 4 Total Activities of Daily Living Score: 16  Total Motor Exam: 35 Total UPDRS Score: 55 PDQ Total Percent: 46.79 %   07/04/2021 PDQ8 Total %: 59      PD Dementia    12/21/2020 MOCA Score (out of 30): 17   07/04/2021 On Exelon (rivastigmine)      Hallucination    04/15/2019 On Nuplazid (pimavanserin): worse when discontinued  06/29/2019 On Seroquel (quetiapine)       Depression    05/06/2014 Geriatric Depression Scale: 13  12/06/2014 On Paxil (paroxetine)   05/27/2015 Wellbutrin (bupropion) caused nausea  05/27/2015 Geriatric Depression Scale: 12 Does not like Paxil (paroxetine)   03/03/2019 Geriatric Depression Scale: 12          Parkinson's disease with Bilateral STN Implants  Her symptoms are stable. No medication changes were recommended during the current  visit. IPG settings were not changed.      PD Dementia  I recommended increasing Exelon (rivastigmine) 3 mg twice a day.    Hallucination  Her symptoms are stable. On Seroquel (quetiapine) and Nuplazid (pimavanserin). No medication changes were recommended during the current  visit.      Depression  Her symptoms are stable. On Cymbalta (duloxetine). No medication changes were recommended during the current  visit.      Patient will follow up in approximately 6 months.

## 2021-07-04 NOTE — Assessment & Plan Note
Her symptoms are stable. On Cymbalta (duloxetine). No medication changes were recommended during the current  visit.

## 2021-07-04 NOTE — Assessment & Plan Note
Her symptoms are stable. On Seroquel (quetiapine) and Nuplazid (pimavanserin). No medication changes were recommended during the current  visit.

## 2021-07-04 NOTE — Assessment & Plan Note
Her symptoms are stable. No medication changes were recommended during the current  visit. IPG settings were not changed.

## 2021-07-27 ENCOUNTER — Encounter: Admit: 2021-07-27 | Discharge: 2021-07-27 | Payer: MEDICARE

## 2021-07-27 MED FILL — NUPLAZID 34 MG PO CAP: 34 mg | ORAL | 30 days supply | Qty: 30 | Fill #2 | Status: AC

## 2021-08-19 ENCOUNTER — Observation Stay: Admit: 2021-08-19 | Discharge: 2021-08-19 | Payer: MEDICARE

## 2021-08-19 ENCOUNTER — Encounter: Admit: 2021-08-19 | Discharge: 2021-08-19 | Payer: MEDICARE

## 2021-08-19 ENCOUNTER — Emergency Department: Admit: 2021-08-19 | Discharge: 2021-08-19 | Payer: MEDICARE

## 2021-08-19 DIAGNOSIS — W19XXXA Unspecified fall, initial encounter: Secondary | ICD-10-CM

## 2021-08-19 DIAGNOSIS — G9340 Encephalopathy, unspecified: Secondary | ICD-10-CM

## 2021-08-19 DIAGNOSIS — R55 Syncope and collapse: Secondary | ICD-10-CM

## 2021-08-19 LAB — URINALYSIS DIPSTICK REFLEX TO CULTURE
NITRITE: NEGATIVE
URINE ASCORBIC ACID, UA: NEGATIVE
URINE BILE: NEGATIVE
URINE BLOOD: NEGATIVE
URINE KETONE: NEGATIVE

## 2021-08-19 LAB — CBC AND DIFF
ABSOLUTE BASO COUNT: 0 K/UL (ref 0–0.20)
ABSOLUTE EOS COUNT: 0.1 K/UL (ref 0–0.45)
ABSOLUTE MONO COUNT: 0.6 K/UL (ref 0–0.80)
BASOPHILS %: 1 % (ref 0–2)
MCH: 25 pg — ABNORMAL LOW (ref 26–34)
MDW (MONOCYTE DISTRIBUTION WIDTH): 23 — ABNORMAL HIGH (ref <20.7)
MONOCYTES %: 6 % (ref 4–12)
PLATELET COUNT: 207 K/UL (ref 150–400)
WBC COUNT: 10 K/UL (ref 4.5–11.0)

## 2021-08-19 LAB — URINALYSIS MICROSCOPIC REFLEX TO CULTURE

## 2021-08-19 LAB — BARBITURATES-URINE RANDOM: BARBITURATES: NEGATIVE mg/dL (ref 0.3–1.2)

## 2021-08-19 LAB — OPIATES-URINE RANDOM: OPIATES: NEGATIVE MMOL/L (ref 21–30)

## 2021-08-19 LAB — POC BLOOD GAS VEN
BASE DEF, VEN POC: 1 MMOL/L
BICARB, VEN POC: 24 MMOL/L
O2 SAT, VEN POC: 50 % — ABNORMAL LOW (ref 55–71)
PCO2, VEN POC: 42 mmHg (ref 36–50)
PH, VEN POC: 7.3 (ref 7.30–7.40)

## 2021-08-19 LAB — COMPREHENSIVE METABOLIC PANEL
CHLORIDE: 106 MMOL/L — ABNORMAL LOW (ref 98–110)
CREATININE: 0.9 mg/dL (ref 0.4–1.00)
EGFR: 59 mL/min — ABNORMAL LOW (ref >60)
GLUCOSE,PANEL: 102 mg/dL — ABNORMAL HIGH (ref 70–100)
POTASSIUM: 3.8 MMOL/L — ABNORMAL LOW (ref 3.5–5.1)
SODIUM: 139 MMOL/L (ref 137–147)

## 2021-08-19 LAB — CANNABINOIDS-URINE RANDOM: CANNABINOIDS (THC): NEGATIVE U/L — ABNORMAL LOW (ref 25–110)

## 2021-08-19 LAB — BENZODIAZEPINES-URINE RANDOM: BENZODIAZEPINES: NEGATIVE g/dL — ABNORMAL HIGH (ref 3.5–5.0)

## 2021-08-19 LAB — ALCOHOL LEVEL: ALCOHOL: <10 mg/dL — ABNORMAL HIGH (ref 8.5–10.6)

## 2021-08-19 LAB — FENTANYL URINE: FENTANYL URINE: NEGATIVE K/UL — ABNORMAL HIGH (ref 3–12)

## 2021-08-19 LAB — POC TROPONIN: TROPONIN I, POC: 0 ng/mL (ref 0.00–0.05)

## 2021-08-19 LAB — POC GLUCOSE: POC GLUCOSE: 106 mg/dL — ABNORMAL HIGH (ref 70–100)

## 2021-08-19 MED ORDER — PAROXETINE HCL 10 MG PO TAB
20 mg | Freq: Every day | ORAL | 0 refills | Status: AC
Start: 2021-08-19 — End: ?
  Administered 2021-08-20: 16:00:00 20 mg via ORAL

## 2021-08-19 MED ORDER — QUETIAPINE 25 MG PO TAB
50 mg | Freq: Every evening | ORAL | 0 refills | Status: AC
Start: 2021-08-19 — End: ?
  Administered 2021-08-20: 02:00:00 50 mg via ORAL

## 2021-08-19 MED ORDER — POLYETHYLENE GLYCOL 3350 17 GRAM PO PWPK
1 | Freq: Every day | ORAL | 0 refills | Status: AC | PRN
Start: 2021-08-19 — End: ?

## 2021-08-19 MED ORDER — AMLODIPINE 5 MG PO TAB
5 mg | Freq: Every day | ORAL | 0 refills | Status: AC
Start: 2021-08-19 — End: ?
  Administered 2021-08-20: 16:00:00 5 mg via ORAL

## 2021-08-19 MED ORDER — QUETIAPINE 25 MG PO TAB
25 mg | Freq: Every day | ORAL | 0 refills | Status: AC
Start: 2021-08-19 — End: ?
  Administered 2021-08-20: 16:00:00 25 mg via ORAL

## 2021-08-19 MED ORDER — DULOXETINE 60 MG PO CPDR
60 mg | Freq: Every day | ORAL | 0 refills | Status: AC
Start: 2021-08-19 — End: ?
  Administered 2021-08-20: 16:00:00 60 mg via ORAL

## 2021-08-19 MED ORDER — ONDANSETRON 4 MG PO TBDI
4 mg | ORAL | 0 refills | Status: AC | PRN
Start: 2021-08-19 — End: ?

## 2021-08-19 MED ORDER — SENNOSIDES-DOCUSATE SODIUM 8.6-50 MG PO TAB
1 | Freq: Every day | ORAL | 0 refills | Status: AC | PRN
Start: 2021-08-19 — End: ?

## 2021-08-19 MED ORDER — MELATONIN 5 MG PO TAB
5 mg | Freq: Every evening | ORAL | 0 refills | Status: AC | PRN
Start: 2021-08-19 — End: ?

## 2021-08-19 MED ORDER — LEVOTHYROXINE 112 MCG PO TAB
112 ug | Freq: Every day | ORAL | 0 refills | Status: AC
Start: 2021-08-19 — End: ?

## 2021-08-19 MED ORDER — GUAIFENESIN 100 MG/5 ML PO LIQD
200 mg | ORAL | 0 refills | Status: AC | PRN
Start: 2021-08-19 — End: ?

## 2021-08-19 MED ORDER — SODIUM CHLORIDE 0.9 % IJ SOLN
50 mL | Freq: Once | INTRAVENOUS | 0 refills | Status: CP
Start: 2021-08-19 — End: ?
  Administered 2021-08-19: 12:00:00 50 mL via INTRAVENOUS

## 2021-08-19 MED ORDER — IOHEXOL 350 MG IODINE/ML IV SOLN
100 mL | Freq: Once | INTRAVENOUS | 0 refills | Status: CP
Start: 2021-08-19 — End: ?
  Administered 2021-08-19: 12:00:00 100 mL via INTRAVENOUS

## 2021-08-19 MED ORDER — ENOXAPARIN 40 MG/0.4 ML SC SYRG
40 mg | Freq: Every day | SUBCUTANEOUS | 0 refills | Status: AC
Start: 2021-08-19 — End: ?
  Administered 2021-08-20: 02:00:00 40 mg via SUBCUTANEOUS

## 2021-08-19 MED ORDER — CHOLECALCIFEROL (VITAMIN D3) 25 MCG (1,000 UNIT) PO TAB
2000 [IU] | Freq: Every day | ORAL | 0 refills | Status: AC
Start: 2021-08-19 — End: ?
  Administered 2021-08-20: 16:00:00 2000 [IU] via ORAL

## 2021-08-19 MED ORDER — NON FORMULARY
Freq: Every day | ORAL | 0 refills | Status: AC
Start: 2021-08-19 — End: ?

## 2021-08-19 MED ORDER — ONDANSETRON HCL (PF) 4 MG/2 ML IJ SOLN
4 mg | INTRAVENOUS | 0 refills | Status: AC | PRN
Start: 2021-08-19 — End: ?
  Administered 2021-08-20: 01:00:00 4 mg via INTRAVENOUS

## 2021-08-19 MED ORDER — RIVASTIGMINE 9.5 MG/24 HOUR TD PT24
1 | Freq: Every day | TRANSDERMAL | 0 refills | Status: AC
Start: 2021-08-19 — End: ?
  Administered 2021-08-20: 16:00:00 1 via TRANSDERMAL

## 2021-08-19 MED ORDER — DIPHTH,PERTUS(ACELL),TETANUS 2.5-8-5 LF-MCG-LF/0.5ML IM SUSP
.5 mL | Freq: Once | INTRAMUSCULAR | 0 refills | Status: AC
Start: 2021-08-19 — End: ?

## 2021-08-19 MED ORDER — PATCH DOCUMENTATION - RIVASTIGMINE 9.5 MG/24HR
Freq: Two times a day (BID) | TRANSDERMAL | 0 refills | Status: AC
Start: 2021-08-19 — End: ?

## 2021-08-19 MED ORDER — BENZONATATE 100 MG PO CAP
100 mg | ORAL | 0 refills | Status: AC
Start: 2021-08-19 — End: ?
  Administered 2021-08-20: 16:00:00 100 mg via ORAL

## 2021-08-19 MED ORDER — FAMOTIDINE 20 MG PO TAB
40 mg | Freq: Every evening | ORAL | 0 refills | Status: AC
Start: 2021-08-19 — End: ?
  Administered 2021-08-20: 02:00:00 40 mg via ORAL

## 2021-08-19 NOTE — Progress Notes
1200 Pt refusing tele pack, RN educated on importance. Pt continues to refuse.    1600 RN offers tele pack again. Pt educated. Pt continues to refuse. Dr. Cinda Quest aware. Will attempt later.

## 2021-08-19 NOTE — ED Notes
Attempted to give report unable to reach nurse.

## 2021-08-19 NOTE — ED Notes
Facility phone number: [phone]

## 2021-08-19 NOTE — H&P (View-Only)
Name:  Alexis Matthews                                             MRN:  [id number]   Admission Date:  08/19/2021                     Assessment/Plan:        HARLEQUIN COLAR is a 75 y.o. female with PMH of Parkinson's disease, dementia, hypertension presented to the ER for evaluation of a fall.     Unwitnessed fall  Questionable syncope  History of Parkinson's disease   Per son has history of multiple falls  -Patient was found on the ground in her room after a fall.  This was unwitnessed by the staff.  Has obvious hematoma to her prescription to her back  -Labs-H&H appears to be stable, no leukocytosis, platelet count 207  -BMP-WNL, LFTs-WNL  -UA no signs of infection  -Urine drug toxicology ordered by ER team-negative  Imaging studies:   CT head: Marland Kitchen Small right frontal scalp contusion. 2.  Bilateral deep brain stimulator leads in unchanged position with intact leads  CTA chest-negative for acute pathology, rule out PE  CT A/P-negative for acute pathology  X-ray of left arm and forearm-negative for acute pathology  Plan:  Since patient had an unwitnessed fall, questionable syncope  Will place the patient under observation for 24 hours  TTE, ultrasound carotid Dopplers  Continue telemetry monitoring  PT/OT  If negative DC planning in the a.m.      Parkinson disease with bilateral STN implants  Parkinson's dementia, hallucination  Depression  > Resume PTA medications Seroquel, Cymbalta, rivastigmine  > Pimavanserin- not able       Plan of care was discussed in detail with the patient   Medication side effects explained  Imaging findings explained, rec-ed outpatient follow up with her pcp to address those findings  They verbalized understanding and an agreement with the above plan  ______________________________________________________________________________    Primary Care Physician: Raye Sorrow, Reggie     Chief Complaint: Unwitnessed fall    History of Present Illness: Alexis Matthews is a 75 y.o. female with PMH of Parkinson's disease, dementia, hypertension presented to the ER for evaluation of a fall.  Patient's son at bedside who provided with a history secondary to Parkinson's dementia.  Patient was last well-known on Thursday as per the son.  Reportedly she has episodes of fall secondary to Parkinson's.  She does get up from the bed by herself without assistance too quickly.  She lives in an assisted living facility.  Reportedly she got up from her bed and was found on the floor by the staff.  Unclear if patient had loss of consciousness associated with the fall.  The staff noted that she was conscious and pleasantly confused which prompted her to come to the ER.  During evaluation patient the incident of fall.  Otherwise denies lightheadedness, dizziness, chest pain, shortness of breath, abdominal pain, nausea, vomiting, fever, chills, dysuria, increased frequency or burning urination, constipation or diarrhea.      Medical History:   Diagnosis Date   ? Acid reflux    ? Adverse drug reaction    ? Anxiety and depression    ? Balance problem 2016   ? 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  Enlarged thyroid    ? Foot callus 2014    left   ? Heart murmur    ? Hip fracture (HCC)    ? Hypertension    ? Palpitation    ? Parkinson disease (HCC)    ? Spinal stenosis    ? Wears hearing aid in both ears      Surgical History:   Procedure Laterality Date   ? LAPAROSCOPY  1973   ? GASTRIC FUNDOPLICATION  2008   ? DEEP BRAIN STIMULATOR PLACEMENT  2010    STN implants   ? PR OPEN TX RIB FRACTURE W/INT FIX UNI 1-2 RIBS Left Feb 2017   ? REMOVAL OF CURRENT SINGLE CHANNEL DEEP BRAIN STIMULATION INTERMITTENT PULSE GENERATORS, IMPLANTATION OF NEW SINGLE CHANNEL DEEP BRAIN STIMULATION INTERMITTENT PULSE GENERATORS, RIGHT CHEST AND LEFT CHEST Bilateral 10/03/2018    Performed by Alvino Blood, MD at [facility]   ? INTERTROCHANTERIC HIP FRACTURE SURGERY  Jule 12 th, 2014    right hip   ? SURGERY      Battery replacement in nerve stimulator     Family History   Problem Relation Age of Onset   ? Diabetes Mother    ? Hypertension Mother    ? Heart Failure Father    ? Coronary Artery Disease Father    ? Hypertension Father    ? High Cholesterol Father    ? Stroke Father    ? Other Brother         bronchiole cancer   ? Inflammatory Bowel Disease Brother         Crohn's   ? Coronary Artery Disease Paternal Grandfather    ? GI Cancer Brother         esopahgeal cancer   ? Cancer-Colon Neg Hx    ? Colon Polyps Neg Hx    ? Celiac Disease Neg Hx    ? Parkinson's  Neg Hx    ? Dementia Neg Hx      Social History     Socioeconomic History   ? Marital status: Married     Spouse name: Ed   ? Number of children: 2   Tobacco Use   ? Smoking status: Passive Smoke Exposure - Never Smoker   ? Smokeless tobacco: Never   Vaping Use   ? Vaping Use: Never used   Substance and Sexual Activity   ? Alcohol use: No     Alcohol/week: 0.0 standard drinks   ? Drug use: No   Other Topics Concern   ? Sleep Concern Yes   ? Stress Concern Yes   ? Back Care Yes   Social History Narrative    06/2014    Married to Ed w/ 2 sons    4 brothers, 3 deceased, 2 w/cancer, 1 w/Crohns. 1 sister, good health    Dad MI w/CABG at 41, smoker, died at 77    Mom dies at 45      Immunizations (includes history and patient reported):   There is no immunization history on file for this patient.        Allergies:  Latex, Primidone, Celexa [citalopram], Flu vaccine [influenza virus vaccines], Lisinopril, and Neurontin [gabapentin]    Medications:  (Not in a hospital admission)    Review of Systems:  A comprehensive  12 point review of organ systems reviewed and was negative except for the ones mentioned in HOPI    Physical Exam:  Vital Signs: Last Filed In 24 Hours  Vital Signs: 24 Hour Range   BP: 133/81 (11/26 0530)  Temp: 36.9 ?C (98.4 ?F) (11/26 0341)  SpO2: 97 % (11/26 0730)  SpO2 Pulse: 92 (11/26 0730) BP: (133-150)/(81-87)   Temp:  [36.9 ?C (98.4 ?F)]   SpO2:  [94 %-97 %]    Intensity Pain Scale (Self Report): 5 (08/19/21 0342)      General:  Alert, awake, oriented x 2-name and year, cooperative, no distress, appears stated age, pleasantly confused  Head: Noted to have bruising over the right frontal region  Eyes:  Conjunctivae/corneas clear   Nose: Nares normal. Mucosa normal.  No drainage or sinus tenderness  Throat: Lips, mucosa and tongue normal  Neck:    Supple, symmetrical, trachea midline, no adenopathy, thyroid: no enlargement/tenderness/nodules  Lungs:  Clear to auscultation bilaterally  Heart:   Regular rate and rhythm, S1, S2 normal, no murmur  Abdomen:  Soft, non-tender.  Bowel sounds normal.  No masses.  No organomegaly.  Extremities: Extremities normal, atraumatic, no cyanosis or edema  Skin: Skin color, texture, turgor normal.    Lymph nodes:  Cervical, supraclavicular and axillary nodes normal  Neurologic: Non focal grossly    Lab/Radiology/Other Diagnostic Tests:  24-hour labs:    Results for orders placed or performed during the hospital encounter of 08/19/21 (from the past 24 hour(s))   CBC AND DIFF    Collection Time: 08/19/21  5:38 AM   Result Value Ref Range    White Blood Cells 10.3 4.5 - 11.0 K/UL    RBC 4.30 4.0 - 5.0 M/UL    Hemoglobin 11.0 (L) 12.0 - 15.0 GM/DL    Hematocrit 16.1 (L) 36 - 45 %    MCV 78.5 (L) 80 - 100 FL    MCH 25.6 (L) 26 - 34 PG    MCHC 32.6 32.0 - 36.0 G/DL    RDW 09.6 (H) 11 - 15 %    Platelet Count 207 150 - 400 K/UL    MPV 8.5 7 - 11 FL    Neutrophils 81 (H) 41 - 77 %    Lymphocytes 11 (L) 24 - 44 %    Monocytes 6 4 - 12 %    Eosinophils 1 0 - 5 %    Basophils 1 0 - 2 %    Absolute Neutrophil Count 8.42 (H) 1.8 - 7.0 K/UL    Absolute Lymph Count 1.12 1.0 - 4.8 K/UL    Absolute Monocyte Count 0.66 0 - 0.80 K/UL    Absolute Eosinophil Count 0.10 0 - 0.45 K/UL    Absolute Basophil Count 0.06 0 - 0.20 K/UL    MDW (Monocyte Distribution Width) 23.2 (H) <20.7   COMPREHENSIVE METABOLIC PANEL    Collection Time: 08/19/21  5:38 AM   Result Value Ref Range Sodium 139 137 - 147 MMOL/L    Potassium 3.8 3.5 - 5.1 MMOL/L    Chloride 106 98 - 110 MMOL/L    Glucose 102 (H) 70 - 100 MG/DL    Blood Urea Nitrogen 22 7 - 25 MG/DL    Creatinine 0.45 0.4 - 1.00 MG/DL    Calcium 8.7 8.5 - 40.9 MG/DL    Total Protein 6.9 6.0 - 8.0 G/DL    Total Bilirubin 0.3 0.3 - 1.2 MG/DL    Albumin 4.0 3.5 - 5.0 G/DL    Alk Phosphatase 68 25 - 110 U/L    AST (SGOT) 14 7 - 40 U/L    CO2 23 21 - 30 MMOL/L  ALT (SGPT) 8 7 - 56 U/L    Anion Gap 10 3 - 12    eGFR 59 (L) >60 mL/min   PROTIME INR (PT)    Collection Time: 08/19/21  5:38 AM   Result Value Ref Range    Protime 11.8 9.5 - 14.2 SEC    INR 1.0 0.8 - 1.2   CREATINE KINASE-CPK    Collection Time: 08/19/21  5:38 AM   Result Value Ref Range    Creatine Kinase 55 21 - 215 U/L   ALCOHOL LEVEL    Collection Time: 08/19/21  5:38 AM   Result Value Ref Range    Alcohol <10 MG/DL   POC GLUCOSE    Collection Time: 08/19/21  5:43 AM   Result Value Ref Range    Glucose, POC 106 (H) 70 - 100 MG/DL   POC TROPONIN    Collection Time: 08/19/21  5:43 AM   Result Value Ref Range    Troponin-I-POC 0.00 0.00 - 0.05 NG/ML   POC BLOOD GAS VEN    Collection Time: 08/19/21  6:07 AM   Result Value Ref Range    PH-VEN-POC 7.38 7.30 - 7.40    PCO2-VEN-POC 42 36 - 50 MMHG    PO2-VEN-POC 27 (L) 33 - 48 MMHG    Base Def-VEN-POC 1.0 MMOL/L    O2 Sat-VEN-POC 50.0 (L) 55 - 71 %    Bicarbonate-VEN-POC 24.4 MMOL/L   AMPHETAMINES-URINE RANDOM    Collection Time: 08/19/21  6:18 AM   Result Value Ref Range    Amphetamines NEG NEG-NEG   BARBITURATES-URINE RANDOM    Collection Time: 08/19/21  6:18 AM   Result Value Ref Range    Barbiturates,Urine NEG NEG-NEG   BENZODIAZEPINES-URINE RANDOM    Collection Time: 08/19/21  6:18 AM   Result Value Ref Range    Benzodiazepines NEG NEG-NEG   CANNABINOIDS-URINE RANDOM    Collection Time: 08/19/21  6:18 AM   Result Value Ref Range    THC NEG NEG-NEG   COCAINE-URINE RANDOM    Collection Time: 08/19/21  6:18 AM   Result Value Ref Range Cocaine-Urine NEG NEG-NEG   OPIATES-URINE RANDOM    Collection Time: 08/19/21  6:18 AM   Result Value Ref Range    Opiates-Urine NEG NEG-NEG   PHENCYCLIDINES-URINE RANDOM    Collection Time: 08/19/21  6:18 AM   Result Value Ref Range    Phencyclidine (PCP) NEG NEG-NEG   FENTANYL URINE    Collection Time: 08/19/21  6:18 AM   Result Value Ref Range    Fentanyl Urine NEG NEG-NEG   URINALYSIS DIPSTICK REFLEX TO CULTURE    Collection Time: 08/19/21  6:22 AM    Specimen: Urine   Result Value Ref Range    Color,UA YELLOW     Turbidity,UA CLEAR CLEAR-CLEAR    Specific Gravity-Urine 1.024 1.005 - 1.030    pH,UA 5.0 5.0 - 8.0    Protein,UA NEG NEG-NEG    Glucose,UA NEG NEG-NEG    Ketones,UA NEG NEG-NEG    Bilirubin,UA NEG NEG-NEG    Blood,UA NEG NEG-NEG    Urobilinogen,UA INCREASED (A) NORM-NORMAL    Nitrite,UA NEG NEG-NEG    Leukocytes,UA TRACE (A) NEG-NEG    Urine Ascorbic Acid, UA NEG NEG-NEG   URINALYSIS MICROSCOPIC REFLEX TO CULTURE    Collection Time: 08/19/21  6:22 AM    Specimen: Urine   Result Value Ref Range    WBCs,UA NONE 0 - 2 /HPF    RBCs,UA 0-2  0 - 3 /HPF    Comment,UA       Criteria for reflex to culture are WBC>10, Positive Nitrite, and/or >=+1   leukocytes. If quantity is not sufficient, an addendum will follow.      Squamous Epithelial Cells 0-2 0 - 5    Calcium Oxalate Crystals MANY      Glucose: (!) 102 (08/19/21 0538)  POC Glucose (Download): (!) 106 (08/19/21 0543)  Pertinent radiology reviewed.    CT HEAD WO CONTRAST    Result Date: 08/19/2021  Head: 1.  No acute intracranial hemorrhage or calvarial fracture. Small right frontal scalp contusion. 2.  Bilateral deep brain stimulator leads in unchanged position with intact leads. 3.  Stable mild cerebral volume loss and mild cerebral white matter chronic small vessel ischemic change. Cervical Spine: 1.  No evidence of acute cervical fracture or traumatic subluxation. 2.  Cervical spondylosis resulting in mild multilevel degenerative central spinal stenosis and marked foraminal stenosis on the right at C5-C6.  Finalized by Dimple Casey, M.D. on 08/19/2021 3:50 AM. Dictated by Dimple Casey, M.D. on 08/19/2021 3:45 AM.     CT SPINE CERVICAL WO CONTRAST    Result Date: 08/19/2021  Head: 1.  No acute intracranial hemorrhage or calvarial fracture. Small right frontal scalp contusion. 2.  Bilateral deep brain stimulator leads in unchanged position with intact leads. 3.  Stable mild cerebral volume loss and mild cerebral white matter chronic small vessel ischemic change. Cervical Spine: 1.  No evidence of acute cervical fracture or traumatic subluxation. 2.  Cervical spondylosis resulting in mild multilevel degenerative central spinal stenosis and marked foraminal stenosis on the right at C5-C6.  Finalized by Dimple Casey, M.D. on 08/19/2021 3:50 AM. Dictated by Dimple Casey, M.D. on 08/19/2021 3:45 AM.     FOREARM 2 VIEWS LEFT    Result Date: 08/19/2021  Findings/Impression: 1.  The left radius and ulna are intact without fracture or obvious subluxation at the elbow and wrist. 2.  No retained radiopaque foreign body is identified.  Finalized by Dimple Casey, M.D. on 08/19/2021 6:06 AM. Dictated by Dimple Casey, M.D. on 08/19/2021 6:05 AM.     HAND MIN 3 VIEWS LEFT    Result Date: 08/19/2021  Findings/Impression: 1.  No fracture or dislocation of the left hand is identified. 2.  Diffuse osteopenia. 3.  Mild diffuse arthrosis of the DIP and PIP joints.  Finalized by Dimple Casey, M.D. on 08/19/2021 6:07 AM. Dictated by Dimple Casey, M.D. on 08/19/2021 6:06 AM.     CT ABD/PELV W CONTRAST    Result Date: 08/19/2021  CHEST: 1. No pulmonary embolism or acute traumatic finding of the thorax.  ABDOMEN AND PELVIS: 1. No acute traumatic injury involving the abdomen and pelvis.  Finalized by Dimple Casey, M.D. on 08/19/2021 6:46 AM. Dictated by Dimple Casey, M.D. on 08/19/2021 6:35 AM.     CTA CHEST PULM EMBOLISM W/CONT    Result Date: 08/19/2021  CHEST: 1. No pulmonary embolism or acute traumatic finding of the thorax.  ABDOMEN AND PELVIS: 1. No acute traumatic injury involving the abdomen and pelvis.  Finalized by Dimple Casey, M.D. on 08/19/2021 6:46 AM. Dictated by Dimple Casey, M.D. on 08/19/2021 6:35 AM.           Time spent > 70 mins

## 2021-08-19 NOTE — ED Notes
Report received, awaiting admitting team, family up dated on plan of care, pt's Son [name] is [place] and may be reached by phone if anything is needed. Pt resting with lights off.

## 2021-08-19 NOTE — ED Notes
Pt taken to ultrasound, awaiting to give report to 11 still.

## 2021-08-20 NOTE — Progress Notes
RESPIRATORY THERAPY  ADULT PROTOCOL EVALUATION      RESPIRATORY PROTOCOL PLAN    Medications       Note: If indicated by protocol, medication orders will be placed by therapist.    Procedures  Oxygen/Humidity: O2 to keep SpO2 > 92%, if on room air for > 24 hours and no other RT modalities are required, then D/C protocol  SpO2: BID & PRN       _____________________________________________________________    PATIENT EVALUATION RESULTS    Chart Review  * Pulmonary Hx: No pulmonary hx OR No smoking hx (Incl e-cigarettes)  or smoking cessation > 8 weeks OR Smoker (Incl e-cigarettes) use in the home    *Surgical Hx: No surgery -OR- Last surgery > 6 weeks    * Chest Imaging: Clear -OR- Not available/ pending    * PFT/Oxygenation: FEV1 or PEFR 70-80% predicted -OR- PaO2 70-80 RA -OR- SpO2 92-95% RA      Patient Assessment  * Respiratory Pattern: Regular pattern and rate -OR- Good chest excursion with deep breathing    * Breath Sounds: Clear and able to auscultate bases posteriorly    * Cough / Sputum: Strong, effective cough -OR- Nonproductive    * Mental Status: Confused &/or uncooperative    * Activity Level: Ambulatory with assistance      Priority Index  Total Points: 4 Points  *Scoring Index: 1    PRIORITY INDEX GUIDELINES*  Priority Points   1 0-9 points   2 9-18 points   3 > 18 points   + Pulm Dx or Home Rx   *Higher points indicate higher acuity.      Therapist: Harrie Foreman, RT  Date: 08/19/2021      Key  AC = Airway clearance  AM = Aerosolized medication  BA = Bland aerosol  DB&C = Deep breathe & cough  FEV1 = Forced expiratory volume in first second)  IC = Inspiratory capacity  LE = Lung expansion  MDI = Metered dose inhaler  Neb = Nebulizer  O2 = Oxygen  Oxim =Oximetry  PEFR = Peak expiratory flow rate  RRT = Rapid Response Team

## 2021-08-23 ENCOUNTER — Encounter: Admit: 2021-08-23 | Discharge: 2021-08-23 | Payer: MEDICARE

## 2021-08-23 NOTE — Progress Notes
The copay assistance application for [name] specialty medication Nuplazid has been submitted to [employer].  A decision from the foundation may take up to 60 business days.  The specialty team will continue to follow.    Patients son submitted PIN online today.    Alexis Matthews  [phone]

## 2021-08-25 ENCOUNTER — Encounter: Admit: 2021-08-25 | Discharge: 2021-08-25 | Payer: MEDICARE

## 2021-08-25 MED FILL — NUPLAZID 34 MG PO CAP: 34 mg | ORAL | 30 days supply | Qty: 30 | Fill #3 | Status: AC

## 2021-08-31 ENCOUNTER — Encounter: Admit: 2021-08-31 | Discharge: 2021-08-31 | Payer: MEDICARE

## 2021-09-15 ENCOUNTER — Encounter: Admit: 2021-09-15 | Discharge: 2021-09-15 | Payer: MEDICARE

## 2021-09-15 NOTE — Progress Notes
Copay assistance was obtained for the specialty medication Nuplazid using grant from [employer] for 2023 and the copay will be $0.  Noel Gerold has stated this copay is affordable.  The specialty pharmacy will pursue additional copay assistance as necessary.  The specialty pharmacy will reach out to the ambulatory clinical pharmacist if the copay becomes unaffordable.    The medication will be delivered to patient's prescription address per the patient's request. per the patient's request.    [facility] Patient Advocate  [phone]

## 2021-09-28 ENCOUNTER — Encounter: Admit: 2021-09-28 | Discharge: 2021-09-28 | Payer: MEDICARE

## 2021-09-28 MED FILL — NUPLAZID 34 MG PO CAP: 34 mg | ORAL | 30 days supply | Qty: 30 | Fill #4 | Status: AC

## 2021-11-02 ENCOUNTER — Encounter: Admit: 2021-11-02 | Discharge: 2021-11-02 | Payer: MEDICARE

## 2021-11-03 ENCOUNTER — Encounter: Admit: 2021-11-03 | Discharge: 2021-11-03 | Payer: MEDICARE

## 2021-11-03 MED FILL — NUPLAZID 34 MG PO CAP: 34 mg | ORAL | 30 days supply | Qty: 30 | Fill #5 | Status: AC

## 2021-11-08 ENCOUNTER — Encounter: Admit: 2021-11-08 | Discharge: 2021-11-08 | Payer: MEDICARE

## 2021-11-29 ENCOUNTER — Encounter: Admit: 2021-11-29 | Discharge: 2021-11-29 | Payer: MEDICARE

## 2021-11-29 NOTE — Progress Notes
Attempted to call Tenna Child Derner/caregiver to verify compliance and assess tolerance of her specialty medication, Nuplazid. No answer. Left voicemail asking patient/caregiver to return call to neurology pharmacist at [phone].    Matthias Hughs, PharmD, [place]  [facility] Pharmacist

## 2021-12-01 ENCOUNTER — Encounter: Admit: 2021-12-01 | Discharge: 2021-12-01 | Payer: MEDICARE

## 2021-12-02 MED FILL — NUPLAZID 34 MG PO CAP: 34 mg | ORAL | 30 days supply | Qty: 30 | Fill #6 | Status: AC

## 2021-12-05 ENCOUNTER — Encounter: Admit: 2021-12-05 | Discharge: 2021-12-05 | Payer: MEDICARE

## 2021-12-05 NOTE — Progress Notes
Second attempt to call Tenna Child Mascio/caregiver to verify compliance and assess tolerance of her specialty medication that was recently started. No answer. Left voicemail asking patient/caregiver to return call to neurology pharmacist at [phone].    Matthias Hughs, PharmD, [place]  [facility] Pharmacist

## 2021-12-08 ENCOUNTER — Encounter: Admit: 2021-12-08 | Discharge: 2021-12-08 | Payer: MEDICARE

## 2021-12-08 DIAGNOSIS — R911 Solitary pulmonary nodule: Secondary | ICD-10-CM

## 2021-12-08 NOTE — Telephone Encounter
Received voicemail from [place], son, reporting patient had nodule show up on chest xray and pain is associated with it. Inquiring if we have any recommendations on a pulmonologist at [place].    Routed to Dr. Eldridge Dace.

## 2021-12-08 NOTE — Telephone Encounter
Contacted son to inform we can place referral if they need one and clinic can decide who would be the best fit to see her. Son agreeable with plan. Referral placed per Dr. Eldridge Dace.

## 2021-12-08 NOTE — Telephone Encounter
Can refer to pulmonary, no one specific

## 2021-12-11 ENCOUNTER — Encounter: Admit: 2021-12-11 | Discharge: 2021-12-11 | Payer: MEDICARE

## 2021-12-11 NOTE — Progress Notes
Pharmacy Medication Re-assessment    The patient's caregiver Alycia Rossetti, son) participated on the patient's behalf. All references to the patient herein were completed with the caregiver on the patient's behalf.    Indication/Regimen  The regimen of PIMAVANSERIN 34 MG PO CAP indefinitely is appropriate for Alexis Matthews who has Parkinson disease (HCC).    Renal dose adjustments are not required. Hepatic dose adjustments are not required. Dose titration is not required.    The patient has the ability to self-administer the medication(s).    Baseline Characteristics  HPI: Patient's son reports that patient has not complained of any hallucinations recently.   Current medications for this indication: Quetiapine     Therapeutic Goals and Monitoring  The goal of therapy is reduction in severity and/or frequency of hallucinations and/or delusions.    Symptom Control  Symptom assessment: stable    Hallucinations/PD Psychosis Reassessment (Nuplazid)  Over the past month have you seen, heard, smelled, or felt things that were not really there? Normal (No hallucinations or psychotic behavior.)  How frequently do you see or hear things (visual and/or auditory hallucinations) that are not there? Never  Can you tell that these things (hallucinations, delusions) are not real? Patient report: I am not having them.  When you have the hallucinations do the hallucinations threaten you in any way? No hallucinations    The patient is making progress toward achieving their therapeutic goals. The plan is to continue current therapy.    Past Medical History and Comorbidities  Patient Active Problem List   Diagnosis   ? Parkinson disease (HCC)   ? Hypothyroidism   ? Osteoarthritis   ? Palpitation   ? Hypertension   ? Dysarthria   ? Hip joint replacement status   ? Aortic root enlargement (HCC)   ? Weakness of voice   ? Anxiety   ? Neurogenic orthostatic hypotension (HCC)   ? Depression   ? At risk for falls   ? Closed displaced fracture of neck of second metacarpal bone of right hand   ? Metatarsalgia of left foot   ? Hallucination   ? Closed burst fracture of lumbar vertebra (HCC)   ? PD Dementia   ? Syncope and collapse   ? Fall at home, initial encounter     Additional comorbidities: no    Labs and Diagnostic Tests  CBC, BMP 08/20/21    Allergies  Allergies   Allergen Reactions   ? Latex SWELLING   ? Primidone MENTAL STATUS CHANGES     Does not like the feeling it gives her feels like I'm outside my body.   ? Celexa [Citalopram] STOMACH UPSET   ? Flu Vaccine [Influenza Virus Vaccines] NAUSEA AND VOMITING   ? Lisinopril SEE COMMENTS     Hypotension and stomach cramps   ? Neurontin [Gabapentin] DIZZINESS     Falls          Immunizations  Vaccine history was reviewed with the patient. Education was provided on the importance of completing vaccines.    There is no immunization history for the selected administration types on file for this patient.    Home Medications    Medication Sig   acetaminophen (TYLENOL) 325 mg tablet Take 650 mg three times day.   amLODIPine (NORVASC) 5 mg tablet Take 5 mg by mouth daily.   benzonatate (TESSALON PERLES) 100 mg capsule Take 100 mg by mouth every 8 hours.   calcium polycarbophil (FIBERCON PO) Take  by mouth. Once every  other day .   cholecalciferol (VITAMIN D-3) 400 unit tab tablet Take 2,000 Units by mouth daily.   cyanocobalamin (RUBRAMIN) 1,000 mcg/mL injection Inject 1 mL into the muscle every 30 days.   duloxetine DR (CYMBALTA) 60 mg capsule TAKE 1 CAPSULE BY MOUTH EVERY DAY   famotidine (PEPCID) 20 mg tablet Take 40 mg by mouth at bedtime daily.   guaiFENesin (ROBITUSSIN) 100 mg/5 mL oral solution Take 10 mL by mouth every 4 hours as needed.   ibuprofen (MOTRIN) 400 mg tablet Take 400 mg by mouth every 6 hours as needed for Pain. Take with food.   levothyroxine (SYNTHROID) 100 mcg tablet Take 112 mcg by mouth daily 30 minutes before breakfast. Indications: BRAND NAME ONLY   loratadine (CLARITIN) 10 mg tablet Take 10 mg by mouth every morning.   melatonin 3 mg tab Take 5 mg by mouth at bedtime daily. Takes 5 mg at hs.   mirabegron (MYRBETRIQ) 50 mg ER tablet Take one tablet by mouth daily.   omeprazole DR(+) (PRILOSEC) 40 mg capsule Take 40 mg by mouth daily before breakfast.   ondansetron (ZOFRAN) 4 mg tablet Take 4 mg by mouth every 12 hours as needed for Nausea or Vomiting.   PARoxetine (PAXIL) 20 mg tablet Take 20 mg by mouth daily.   pimavanserin (NUPLAZID) 34 mg capsule Take one capsule by mouth daily.   QUEtiapine (SEROQUEL) 25 mg tablet Taking one in the am and two at bedtime.   rivastigmine tartrate (EXELON) 3 mg capsule Take one capsule by mouth twice daily.     Medication Reconciliation  Medication history and reconciliation were performed (including prescription medications, supplements, over the counter, and herbal products). The medication list was updated and the patient's current medication list is included above. The patient was instructed to speak with their health care provider before starting any new drug, including prescription or over the counter, natural / herbal products, or vitamins.    Drug Interactions    Drug-Drug Interactions  Drug-drug interactions were evaluated. There were clinically significant drug-drug interactions. The following drug-drug interactions were identified: quetiapine and pimavanersin and will be managed by EKGs as needed.     Drug-Food Interactions  Drug-food interactions were evaluated. There are clinically significant drug-food interactions. with or without food. Grapefruit/grapefruit juice should be avoided.    Adverse Drug Reactions  Adverse drug reactions were reviewed with the patient.    Significant adverse drug reaction(s) were not identified.    Side effect(s) were not reported.    Adherence  Refill and adherence history were reviewed with the patient. The patient was educated on the importance of adherence.    Patient is adherent with refills: yes  Patient is meeting refill adherence goal: yes    Patient reported 0 missed doses over the past 7 days.  Significance of missed doses: NA - no missed doses   Patient is meeting reported adherence goal.    Safety Precautions    Risk Evaluation and Mitigation (REMS) Assessment: REMS is not required for this medication.    Safety precautions were addressed and discussed with the patient as applicable.    Contraindications: TOMECIA TAHA does not have contraindications to this medication.      Pregnancy Status: Female, not of child-bearing potential, education not applicable.    Medication Education  Counseling was not completed because patient was previously educated and did not require additional counseling.    AYLEIGH CUADRA was given the opportunity to ask questions but did  not have any questions at the time. Patient was reminded of the refill process and encouraged to call with questions. The monitoring and follow-up plan was discussed with the patient. The patient was instructed to contact their health care provider if their symptoms or health problems do not get better or if they become worse. The patient should contact the specialty pharmacy at [phone] if they have any questions or concerns regarding their medication therapy. The patient verbalized acceptance and understanding.    Follow-up Plan  The patient will be reassessed within 1 year.    The medication(s) will be shipped from [facility].    Alvin Critchley, PHARMD, BCPS

## 2021-12-28 ENCOUNTER — Encounter: Admit: 2021-12-28 | Discharge: 2021-12-28 | Payer: MEDICARE

## 2021-12-28 MED ORDER — NUPLAZID 34 MG PO CAP
34 mg | ORAL_CAPSULE | Freq: Every day | ORAL | 5 refills
Start: 2021-12-28 — End: ?

## 2021-12-29 ENCOUNTER — Encounter: Admit: 2021-12-29 | Discharge: 2021-12-29 | Payer: MEDICARE

## 2022-01-02 ENCOUNTER — Encounter: Admit: 2022-01-02 | Discharge: 2022-01-02 | Payer: MEDICARE

## 2022-01-02 ENCOUNTER — Ambulatory Visit: Admit: 2022-01-02 | Discharge: 2022-01-02 | Payer: MEDICARE

## 2022-01-02 DIAGNOSIS — G20 Parkinson's disease: Secondary | ICD-10-CM

## 2022-01-02 DIAGNOSIS — C801 Malignant (primary) neoplasm, unspecified: Secondary | ICD-10-CM

## 2022-01-02 DIAGNOSIS — M4800 Spinal stenosis, site unspecified: Secondary | ICD-10-CM

## 2022-01-02 DIAGNOSIS — E049 Nontoxic goiter, unspecified: Secondary | ICD-10-CM

## 2022-01-02 DIAGNOSIS — Z974 Presence of external hearing-aid: Secondary | ICD-10-CM

## 2022-01-02 DIAGNOSIS — I10 Essential (primary) hypertension: Secondary | ICD-10-CM

## 2022-01-02 DIAGNOSIS — L84 Corns and callosities: Secondary | ICD-10-CM

## 2022-01-02 DIAGNOSIS — T50905A Adverse effect of unspecified drugs, medicaments and biological substances, initial encounter: Secondary | ICD-10-CM

## 2022-01-02 DIAGNOSIS — F3289 Other specified depressive episodes: Secondary | ICD-10-CM

## 2022-01-02 DIAGNOSIS — R109 Unspecified abdominal pain: Secondary | ICD-10-CM

## 2022-01-02 DIAGNOSIS — R2689 Other abnormalities of gait and mobility: Secondary | ICD-10-CM

## 2022-01-02 DIAGNOSIS — F419 Anxiety disorder, unspecified: Secondary | ICD-10-CM

## 2022-01-02 DIAGNOSIS — K219 Gastro-esophageal reflux disease without esophagitis: Secondary | ICD-10-CM

## 2022-01-02 DIAGNOSIS — R002 Palpitations: Secondary | ICD-10-CM

## 2022-01-02 DIAGNOSIS — S72009A Fracture of unspecified part of neck of unspecified femur, initial encounter for closed fracture: Secondary | ICD-10-CM

## 2022-01-02 DIAGNOSIS — R011 Cardiac murmur, unspecified: Secondary | ICD-10-CM

## 2022-01-02 DIAGNOSIS — R443 Hallucinations, unspecified: Secondary | ICD-10-CM

## 2022-01-02 MED ORDER — MIRTAZAPINE 15 MG PO TAB
15 mg | ORAL_TABLET | Freq: Every evening | ORAL | 5 refills | Status: AC
Start: 2022-01-02 — End: ?

## 2022-01-02 NOTE — Assessment & Plan Note
Her symptoms are stable. On Seroquel (quetiapine) and Nuplazid (pimavanserin). No medication changes were recommended during the current  visit.

## 2022-01-02 NOTE — Assessment & Plan Note
Her symptoms are worse. I recommended discontinuing Paxil (paroxetine). Patient was started on Remeron (mirtazepine) 15 mg at bedtime for depression and insomnia. Side effects of the medication were discussed with the patient and they were given a list of common side effects.

## 2022-01-02 NOTE — Assessment & Plan Note
Her symptoms are stable. On Exelon (rivastigmine). No medication changes were recommended during the current  visit.

## 2022-01-02 NOTE — Assessment & Plan Note
Her symptoms are stable. No medication changes were recommended during the current  visit. IPG settings were not changed.

## 2022-01-02 NOTE — Progress Notes
Alexis Matthews is a 76 y.o. female.    Subjective:             History of Present Illness      Parkinson's Disease Follow-Up Visit    Since my last visit I am: Slightly worse    Symptoms Scale:    Memory problems: Mild, bothersome  Hallucinations/delusions: Mild, illusions  Depression: Slight, not bothersome  Anxiety: Slight, not bothersome  Apathy: Slight, some loss of interest  Impulsive behavior: None  Nighttime sleep: Moderate, wake up multiple times  Daytime sleepiness: Slight, occasionally  Vivid dreams: Mild, frequently not bothersome  REM sleep behavior disorder: Slight, rarely  Restless leg syndrome: None  Pain or muscle cramps: Slight, pain or cramps occasionally  Urination: Mild, go frequently, bothersome  Constipation: None  Dizziness or lightheadedness: Mild, often on standing  Tiredness/Fatigue: Slight, reduced stamina  Falling: Moderate, multiple times a month  Personal care assistance: Marked, I have a lot of difficulty and I need help with a majority of tasks daily    Total Score:  Total Score: 27    Assistive Devices:  Assistive devices for getting around: Walker    OFF Time:    OFF time: No    Dyskinesia:    Dyskinesia while awake: No    Employment Status:    Employment: Retired - not due to PD          Review of Systems   HENT: Positive for trouble swallowing.    Gastrointestinal: Positive for nausea. Negative for abdominal distention, abdominal pain, anal bleeding, blood in stool, constipation, diarrhea, rectal pain and vomiting.   Genitourinary: Positive for enuresis and urgency. Negative for decreased urine volume, difficulty urinating, dyspareunia, dysuria, flank pain, frequency, genital sores, hematuria, menstrual problem, pelvic pain, vaginal bleeding, vaginal discharge and vaginal pain.   Neurological: Positive for speech difficulty.         Objective:         ? acetaminophen (TYLENOL) 325 mg tablet Take 650 mg two times day.   ? amLODIPine (NORVASC) 5 mg tablet Take one tablet by mouth daily.   ? benzonatate (TESSALON PERLES) 100 mg capsule Take one capsule by mouth every 6 hours as needed.   ? cholecalciferol (VITAMIN D-3) 400 unit tab tablet Take 2.5 tablets by mouth daily.   ? cyanocobalamin (RUBRAMIN) 1,000 mcg/mL injection Inject 1 mL into the muscle every 30 days.   ? duloxetine DR (CYMBALTA) 60 mg capsule TAKE 1 CAPSULE BY MOUTH EVERY DAY   ? famotidine (PEPCID) 20 mg tablet Take two tablets by mouth at bedtime daily.   ? guaiFENesin (ROBITUSSIN) 100 mg/5 mL oral solution Take 15 mL by mouth every 4 hours as needed.   ? ibuprofen (MOTRIN) 400 mg tablet Take one tablet by mouth every 6 hours as needed for Pain. Take with food.   ? levothyroxine (SYNTHROID) 100 mcg tablet Take 112 mcg by mouth daily 30 minutes before breakfast. Indications: BRAND NAME ONLY   ? loratadine (CLARITIN) 10 mg tablet Take one tablet by mouth every morning.   ? melatonin 3 mg tab Take 5 mg by mouth at bedtime daily. Takes 5 mg at hs.   ? mirabegron (MYRBETRIQ) 50 mg ER tablet Take one tablet by mouth daily.   ? mometasone (NASONEX) 50 mcg/actuation nasal spray Apply two sprays to each nostril as directed daily.   ? omeprazole DR(+) (PRILOSEC) 40 mg capsule Take one capsule by mouth daily before breakfast.   ? ondansetron (  ZOFRAN) 4 mg tablet Take one tablet by mouth every 12 hours as needed for Nausea or Vomiting.   ? pimavanserin (NUPLAZID) 34 mg capsule Take one capsule by mouth daily.   ? QUEtiapine (SEROQUEL) 25 mg tablet Taking one in the am and two at bedtime.   ? rivastigmine tartrate (EXELON) 3 mg capsule Take one capsule by mouth twice daily.       Body mass index is 26.31 kg/m?Marland Kitchen     Physical Exam    EXAMINATION:     ORIENTATION: Alert and Oriented.    Cranial Nerves:  reduced facial expression and soft voice.        Right Left   Bradkinesia  Mild Mild   Tremor Hands Resting None None    Postural None None    Kinetic None None           Tremor Other: None  Dyskinesia: None  Muscle Strength: Unremarkable  Gait: Needs assistance to walk and stand         PDQ8 - Quality of Life  Had difficulty getting around in public places?: Always   Had difficulty dressing?: Always   Felt depressed?: Often   Had problems with your close personal relationships?: Always     Had problems with your concentration, for example, when reading or watching TV?: Never   Felt unable to communicate effectively?: Often   Had painful muscle cramps or spasms?: Sometimes   Felt embarrassed in public due to having Parkinson's disease?: Never              PDQ8 Total Score (32 Possible): 20  PDQ8 Total %: 63                  TUG: Cannot do      Assessment and Plan:    Problem   Parkinson disease with STN Implants    Symptoms began in 2001, initial symptoms was right hand tremor. Diagnosed with Parkinson's disease in 2001. Atypical PD Features: None  Patient has difficulty tolerating Sinemet (carbidopa/levodopa) and dopamine agonists.   09/29/08 UPDRS Motor OFF 36  Underwent bilateral Subthalamic stimulator implants in 2010 for bothersome tremor with marked improvement.   03/24/2012 Mentation Score: 2 Activities of Daily Living Score: 4 Motor Exam:19 Total UPDRS Score: 25    PDQ 39 IMPACT PDQ Total Percent: 6.41 %  04/30/2013  Mentation Score: 3 Activities of Daily Living Score: 13 Motor Exam: 28 Total UPDRS Score: 44   PDQ 39 IMPACT PDQ Total Percent: 5.77 %   05/06/2014 Total Mentation Score: 5 Total Activities of Daily Living Score: 7 Total Motor Exam: 18 Total UPDRS Score: 30  PDQ Total Percent: 17.95 %  05/27/2015 Total Mentation Score: 4 Total Activities of Daily Living Score: 16 Total Motor Exam: 34 Total UPDRS Score: 54   PDQ Total Percent: 33.33 %   Patient was evaluated by Occupational Therapist, Kelli Reiling.    Nausea and fogginess with Sinemet (carbidopa/levodopa) 25/100   09/10/2017 PDQ Total Percent: 35.26 %   Total Mentation Score: 4 Total Activities of Daily Living Score: 15 Total Motor Exam: 29 Total UPDRS Score: 48   01/13/2018 Side effects to Mirapex (pramipexole)   Rytary (carbidopa/levodopa) extended release capsules causing side effects  12/21/2020 Total Mentation Score: 4 Total Activities of Daily Living Score: 16  Total Motor Exam: 35 Total UPDRS Score: 55 PDQ Total Percent: 46.79 %   07/04/2021 PDQ8 Total %: 59   01/02/2022 PDQ8 Total %: 63  PD Dementia    12/21/2020 MOCA Score (out of 30): 17   07/04/2021 On Exelon (rivastigmine)      Hallucination    04/15/2019 On Nuplazid (pimavanserin): worse when discontinued  06/29/2019 On Seroquel (quetiapine)       Depression    05/06/2014 Geriatric Depression Scale: 13  12/06/2014 On Paxil (paroxetine)   05/27/2015 Wellbutrin (bupropion) caused nausea  Does not like Paxil (paroxetine)   03/03/2019 Geriatric Depression Scale: 12          Parkinson disease with STN Implants  Her symptoms are stable. No medication changes were recommended during the current  visit. IPG settings were not changed.      PD Dementia  Her symptoms are stable. On Exelon (rivastigmine). No medication changes were recommended during the current  visit.      Depression  Her symptoms are worse. I recommended discontinuing Paxil (paroxetine). Patient was started on Remeron (mirtazepine) 15 mg at bedtime for depression and insomnia. Side effects of the medication were discussed with the patient and they were given a list of common side effects.        Hallucination  Her symptoms are stable. On Seroquel (quetiapine) and Nuplazid (pimavanserin). No medication changes were recommended during the current  visit.      Patient will follow up in approximately 6 months.

## 2022-01-03 ENCOUNTER — Encounter: Admit: 2022-01-03 | Discharge: 2022-01-03 | Payer: MEDICARE

## 2022-01-03 DIAGNOSIS — R911 Solitary pulmonary nodule: Secondary | ICD-10-CM

## 2022-01-11 ENCOUNTER — Encounter: Admit: 2022-01-11 | Discharge: 2022-01-11 | Payer: MEDICARE

## 2022-01-11 MED ORDER — NUPLAZID 34 MG PO CAP
34 mg | ORAL_CAPSULE | Freq: Every day | ORAL | 5 refills
Start: 2022-01-11 — End: ?

## 2022-01-12 ENCOUNTER — Encounter: Admit: 2022-01-12 | Discharge: 2022-01-12 | Payer: MEDICARE

## 2022-01-12 MED ORDER — NUPLAZID 34 MG PO CAP
34 mg | ORAL_CAPSULE | Freq: Every day | ORAL | 5 refills
Start: 2022-01-12 — End: ?

## 2022-01-12 MED ORDER — NUPLAZID 34 MG PO CAP
34 mg | ORAL_CAPSULE | Freq: Every day | ORAL | 5 refills | Status: AC
Start: 2022-01-12 — End: ?
  Filled 2022-01-15: qty 30, 30d supply, fill #1

## 2022-01-12 NOTE — Telephone Encounter
Alexis Matthews called saying [facility] did not ok the refill for his mother. He did not know why but it looks like the rx has expired ( last rx sent 10.0422). Nuplazid refill pended and on current plan of care. Last office visit was on 4.11.23  and f/u is scheduled for 11.07.23. Looks like this was pended already but did change the pharmacy to [facility].

## 2022-01-15 ENCOUNTER — Encounter: Admit: 2022-01-15 | Discharge: 2022-01-15 | Payer: MEDICARE

## 2022-01-19 ENCOUNTER — Encounter: Admit: 2022-01-19 | Discharge: 2022-01-19 | Payer: MEDICARE

## 2022-02-07 ENCOUNTER — Encounter: Admit: 2022-02-07 | Discharge: 2022-02-07 | Payer: MEDICARE

## 2022-02-07 MED FILL — NUPLAZID 34 MG PO CAP: 34 mg | ORAL | 30 days supply | Qty: 30 | Fill #2 | Status: AC

## 2022-03-05 ENCOUNTER — Encounter: Admit: 2022-03-05 | Discharge: 2022-03-05 | Payer: MEDICARE

## 2022-03-06 ENCOUNTER — Encounter: Admit: 2022-03-06 | Discharge: 2022-03-06 | Payer: MEDICARE

## 2022-03-06 MED FILL — NUPLAZID 34 MG PO CAP: 34 mg | ORAL | 30 days supply | Qty: 30 | Fill #3 | Status: AC

## 2022-03-12 ENCOUNTER — Encounter: Admit: 2022-03-12 | Discharge: 2022-03-12 | Payer: MEDICARE

## 2022-03-30 ENCOUNTER — Encounter: Admit: 2022-03-30 | Discharge: 2022-03-30 | Payer: MEDICARE

## 2022-03-30 NOTE — Progress Notes
Spoke to patient he stt a company handles all her meds we don't need to call    The refill was not processed per patient request. Ambulatory pharmacist notified.    [place]  [phone]

## 2022-03-31 MED FILL — NUPLAZID 34 MG PO CAP: 34 mg | ORAL | 30 days supply | Qty: 30 | Fill #4 | Status: AC

## 2022-04-08 ENCOUNTER — Encounter: Admit: 2022-04-08 | Discharge: 2022-04-08 | Payer: MEDICARE

## 2022-04-20 ENCOUNTER — Encounter: Admit: 2022-04-20 | Discharge: 2022-04-20 | Payer: MEDICARE

## 2022-04-23 ENCOUNTER — Encounter: Admit: 2022-04-23 | Discharge: 2022-04-23 | Payer: MEDICARE

## 2022-04-23 NOTE — Progress Notes
Date of Service: 04/24/2022    Subjective:             Alexis Matthews is a 76 y.o. female.    History of Present Illness    Ms. Butkowski is a pleasant 76 yo with past medical history significant for Parkinson's disease, dementia, and hypertension. Presented to ED in November following a fall. CT imaging showed 2 cm LUL nodule. Pt was originally referred to our procedural clinic. Upon review of old imaging appears nodule has been present dating back to 2014. Pt denies a history of tobacco abuse. She does note second hand smoke exposure. Denies pulmonary symptoms.        Review of Systems  Negative for pulmonary complaints.   Denies cough, chest pain, wheeze, weight loss, shortness of breath. Pt is rather debilitated due to her neurologic disease.       Objective:         ? acetaminophen (TYLENOL) 325 mg tablet Take 650 mg two times day.   ? amLODIPine (NORVASC) 5 mg tablet Take one tablet by mouth daily.   ? benzonatate (TESSALON PERLES) 100 mg capsule Take one capsule by mouth every 6 hours as needed.   ? cholecalciferol (VITAMIN D-3) 400 unit tab tablet Take 2.5 tablets by mouth daily.   ? cyanocobalamin (RUBRAMIN) 1,000 mcg/mL injection Inject 1 mL into the muscle every 30 days.   ? duloxetine DR (CYMBALTA) 60 mg capsule TAKE 1 CAPSULE BY MOUTH EVERY DAY   ? famotidine (PEPCID) 20 mg tablet Take two tablets by mouth at bedtime daily.   ? guaiFENesin (ROBITUSSIN) 100 mg/5 mL oral solution Take 15 mL by mouth every 4 hours as needed.   ? ibuprofen (MOTRIN) 400 mg tablet Take one tablet by mouth every 6 hours as needed for Pain. Take with food.   ? levothyroxine (SYNTHROID) 100 mcg tablet Take 112 mcg by mouth daily 30 minutes before breakfast. Indications: BRAND NAME ONLY   ? loratadine (CLARITIN) 10 mg tablet Take one tablet by mouth every morning.   ? melatonin 3 mg tab Take 5 mg by mouth at bedtime daily. Takes 5 mg at hs.   ? mirabegron (MYRBETRIQ) 50 mg ER tablet Take one tablet by mouth daily.   ? mirtazapine (REMERON) 15 mg tablet Take one tablet by mouth at bedtime daily.   ? mometasone (NASONEX) 50 mcg/actuation nasal spray Apply two sprays to each nostril as directed daily.   ? omeprazole DR(+) (PRILOSEC) 40 mg capsule Take one capsule by mouth daily before breakfast.   ? ondansetron (ZOFRAN) 4 mg tablet Take one tablet by mouth every 12 hours as needed for Nausea or Vomiting.   ? pimavanserin (NUPLAZID) 34 mg capsule Take one capsule by mouth daily.   ? QUEtiapine (SEROQUEL) 25 mg tablet Taking one in the am and two at bedtime.   ? rivastigmine tartrate (EXELON) 3 mg capsule Take one capsule by mouth twice daily.     There were no vitals filed for this visit.  There is no height or weight on file to calculate BMI.     Physical Exam  Vitals reviewed.   Constitutional:       General: She is not in acute distress.     Appearance: Normal appearance. She is well-developed. She is not diaphoretic.      Comments: Slow speech due to Parkinson's disease. Alert and oriented. Answers all questions appropriately.    HENT:      Head: Normocephalic and  atraumatic.      Right Ear: External ear normal.      Left Ear: External ear normal.      Nose: Nose normal.      Mouth/Throat:      Pharynx: No oropharyngeal exudate.   Eyes:      General: No scleral icterus.        Right eye: No discharge.         Left eye: No discharge.      Pupils: Pupils are equal, round, and reactive to light.   Neck:      Thyroid: No thyromegaly.      Trachea: No tracheal deviation.   Cardiovascular:      Rate and Rhythm: Normal rate and regular rhythm.      Heart sounds: Normal heart sounds. No murmur heard.     No friction rub. No gallop.   Pulmonary:      Effort: Pulmonary effort is normal. No respiratory distress.      Breath sounds: Normal breath sounds. No stridor. No wheezing or rales.   Chest:      Chest wall: No tenderness.   Abdominal:      Palpations: Abdomen is soft.   Musculoskeletal:         General: No tenderness or deformity. Normal range of motion.      Cervical back: Normal range of motion and neck supple.   Lymphadenopathy:      Cervical: No cervical adenopathy.   Skin:     General: Skin is warm and dry.      Coloration: Skin is not pale.      Findings: No erythema or rash.   Neurological:      Mental Status: She is alert and oriented to person, place, and time.   Psychiatric:         Behavior: Behavior normal.         Thought Content: Thought content normal.         Judgment: Judgment normal.         CXR 2014:  CHEST 2 VIEWS ? ? ? ? ? ? ? ? ? IMPRESSION:     1. NODULAR OPACITY ON THE LATERAL RADIOGRAPH, WHICH IS INCOMPLETELY   EVALUATED. FURTHER EVALUATION WITH CT CHEST IS RECOMMENDED.     2. NO RADIOGRAPHIC EVIDENCE OF PNEUMONIA.     Residents: BRANDY CONNER ?D.O. ? ?   Electronically signed on: Mar 08 2013 10:57AM by Nicholos Johns M.D.           [id number]:  IMPRESSION       CHEST:   1. ?Nondisplaced left rib fracture deformities, some of which appear acute   and some of which appear chronic in nature. A nondisplaced, age   indeterminant right posterior ninth rib fracture deformity is also noted.   Recommend correlation for point tenderness at these sites.   2. ?No significant change in size of a round soft tissue mass in the   lingula since 2015. This is most likely benign, such as a hamartoma or   noncalcified granuloma.   3. 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   4. ?Small hiatal hernia.           08-19-2021:  Airway, Lungs, and Pleura: Unchanged circumscribed low-attenuation nodule   in the left upper lobe measuring 2.0 cm, likely hamartoma. The central   airways are patent. Mild lower lobe atelectasis, lingular atelectasis, and   right middle lobe atelectasis. No pleural effusion.  Chest Wall and Osseous Structures: Chronic left 10-11 and right C4-5 and   11th rib fractures. Thoracic spondylosis. No compression fracture or   malalignment. The brain stimulator devices are present in the anterior   chest wall bilaterally with leads coursing cephalad into the neck     04-24-2022: CT chest           Assessment and Plan:    Ms. Dorame is a pleasant 76 yo female with:    1. Stable LUL nodule dating back to 2014. Most consistent with a benign hamartoma. No further follow-up needed. Pt is asymptomatic. Discharge from clinic.                               Total time spent on this encounter: 45 minutes  -- Preparing to see the patient  -- Obtaining and Reviewing separately obtained history  -- Performing a medically appropriate examination and/or evaluation  -- Counseling and educating the patient/family.caregiver  -- Ordering medications, tests, or procedures  -- Referring andcommunication with other health care professionals  -- Documenting clinical information in the electronic or other health record  -- Independently interpreting results and communicating results to the patient/family/caregiver  -- Care Coordination

## 2022-04-24 ENCOUNTER — Encounter: Admit: 2022-04-24 | Discharge: 2022-04-24 | Payer: MEDICARE

## 2022-04-24 ENCOUNTER — Ambulatory Visit: Admit: 2022-04-24 | Discharge: 2022-04-24 | Payer: MEDICARE

## 2022-04-24 DIAGNOSIS — R002 Palpitations: Secondary | ICD-10-CM

## 2022-04-24 DIAGNOSIS — S72009A Fracture of unspecified part of neck of unspecified femur, initial encounter for closed fracture: Secondary | ICD-10-CM

## 2022-04-24 DIAGNOSIS — K219 Gastro-esophageal reflux disease without esophagitis: Secondary | ICD-10-CM

## 2022-04-24 DIAGNOSIS — F419 Anxiety disorder, unspecified: Secondary | ICD-10-CM

## 2022-04-24 DIAGNOSIS — C801 Malignant (primary) neoplasm, unspecified: Secondary | ICD-10-CM

## 2022-04-24 DIAGNOSIS — E049 Nontoxic goiter, unspecified: Secondary | ICD-10-CM

## 2022-04-24 DIAGNOSIS — Z974 Presence of external hearing-aid: Secondary | ICD-10-CM

## 2022-04-24 DIAGNOSIS — M4800 Spinal stenosis, site unspecified: Secondary | ICD-10-CM

## 2022-04-24 DIAGNOSIS — R109 Unspecified abdominal pain: Secondary | ICD-10-CM

## 2022-04-24 DIAGNOSIS — T50905A Adverse effect of unspecified drugs, medicaments and biological substances, initial encounter: Secondary | ICD-10-CM

## 2022-04-24 DIAGNOSIS — G20 Parkinson's disease: Secondary | ICD-10-CM

## 2022-04-24 DIAGNOSIS — R2689 Other abnormalities of gait and mobility: Secondary | ICD-10-CM

## 2022-04-24 DIAGNOSIS — R911 Solitary pulmonary nodule: Secondary | ICD-10-CM

## 2022-04-24 DIAGNOSIS — L84 Corns and callosities: Secondary | ICD-10-CM

## 2022-04-24 DIAGNOSIS — I10 Essential (primary) hypertension: Secondary | ICD-10-CM

## 2022-04-24 DIAGNOSIS — R011 Cardiac murmur, unspecified: Secondary | ICD-10-CM

## 2022-04-24 MED FILL — NUPLAZID 34 MG PO CAP: 34 mg | ORAL | 30 days supply | Qty: 30 | Fill #5 | Status: AC

## 2022-04-24 NOTE — Patient Instructions
My nurse is Tamiyah Moulin, RN.  She can be reached at [phone].     Please contact my nurse with any signs and symptoms of worsening productive cough with thick secretions, blood in sputum, chest tightness/pain, shortness of breath, fever, chills, night sweats, or any questions or concerns.     For refills on medications, please have your pharmacy fax a refill authorization request form to our office at [phone]. Please allow at least 3 business days for refill requests.     For urgent issues after business hours/weekends/holidays call [phone] and request for the pulmonary fellow to be paged.

## 2022-04-26 ENCOUNTER — Encounter: Admit: 2022-04-26 | Discharge: 2022-04-26 | Payer: MEDICARE

## 2022-05-14 ENCOUNTER — Encounter: Admit: 2022-05-14 | Discharge: 2022-05-14 | Payer: MEDICARE

## 2022-05-15 ENCOUNTER — Encounter: Admit: 2022-05-15 | Discharge: 2022-05-15 | Payer: MEDICARE

## 2022-05-16 ENCOUNTER — Encounter: Admit: 2022-05-16 | Discharge: 2022-05-16 | Payer: MEDICARE

## 2022-05-16 MED FILL — NUPLAZID 34 MG PO CAP: 34 mg | ORAL | 30 days supply | Qty: 30 | Fill #6 | Status: AC

## 2022-06-11 ENCOUNTER — Encounter: Admit: 2022-06-11 | Discharge: 2022-06-11 | Payer: MEDICARE

## 2022-06-24 ENCOUNTER — Encounter: Admit: 2022-06-24 | Discharge: 2022-06-24 | Payer: MEDICARE

## 2022-06-24 MED ORDER — NUPLAZID 34 MG PO CAP
34 mg | ORAL_CAPSULE | Freq: Every day | ORAL | 5 refills
Start: 2022-06-24 — End: ?

## 2022-06-25 ENCOUNTER — Encounter: Admit: 2022-06-25 | Discharge: 2022-06-25 | Payer: MEDICARE

## 2022-07-10 ENCOUNTER — Encounter: Admit: 2022-07-10 | Discharge: 2022-07-10 | Payer: MEDICARE

## 2022-07-10 MED FILL — NUPLAZID 34 MG PO CAP: 34 mg | ORAL | 30 days supply | Qty: 30 | Fill #1 | Status: AC

## 2022-07-12 ENCOUNTER — Encounter: Admit: 2022-07-12 | Discharge: 2022-07-12 | Payer: MEDICARE

## 2022-07-12 NOTE — Telephone Encounter
Called [name] to let him know we needed to move [facility] appointments on Nov 7th at 1:30.  He was willing to move the Nov 20 th at 1:30. Nov 7 appointments were cancelled and Nov 20 th was scheduled.

## 2022-07-30 ENCOUNTER — Encounter: Admit: 2022-07-30 | Discharge: 2022-07-30 | Payer: MEDICARE

## 2022-08-02 ENCOUNTER — Encounter: Admit: 2022-08-02 | Discharge: 2022-08-02 | Payer: MEDICARE

## 2022-08-02 MED FILL — NUPLAZID 34 MG PO CAP: 34 mg | ORAL | 30 days supply | Qty: 30 | Fill #2 | Status: AC

## 2022-08-13 ENCOUNTER — Encounter: Admit: 2022-08-13 | Discharge: 2022-08-13 | Payer: MEDICARE

## 2022-08-13 ENCOUNTER — Ambulatory Visit: Admit: 2022-08-13 | Discharge: 2022-08-13 | Payer: MEDICARE

## 2022-08-13 DIAGNOSIS — R2689 Other abnormalities of gait and mobility: Secondary | ICD-10-CM

## 2022-08-13 DIAGNOSIS — R002 Palpitations: Secondary | ICD-10-CM

## 2022-08-13 DIAGNOSIS — R443 Hallucinations, unspecified: Secondary | ICD-10-CM

## 2022-08-13 DIAGNOSIS — K219 Gastro-esophageal reflux disease without esophagitis: Secondary | ICD-10-CM

## 2022-08-13 DIAGNOSIS — Z974 Presence of external hearing-aid: Secondary | ICD-10-CM

## 2022-08-13 DIAGNOSIS — R109 Unspecified abdominal pain: Secondary | ICD-10-CM

## 2022-08-13 DIAGNOSIS — S72009A Fracture of unspecified part of neck of unspecified femur, initial encounter for closed fracture: Secondary | ICD-10-CM

## 2022-08-13 DIAGNOSIS — M4800 Spinal stenosis, site unspecified: Secondary | ICD-10-CM

## 2022-08-13 DIAGNOSIS — F419 Anxiety disorder, unspecified: Secondary | ICD-10-CM

## 2022-08-13 DIAGNOSIS — F3289 Other specified depressive episodes: Secondary | ICD-10-CM

## 2022-08-13 DIAGNOSIS — T50905A Adverse effect of unspecified drugs, medicaments and biological substances, initial encounter: Secondary | ICD-10-CM

## 2022-08-13 DIAGNOSIS — C801 Malignant (primary) neoplasm, unspecified: Secondary | ICD-10-CM

## 2022-08-13 DIAGNOSIS — R011 Cardiac murmur, unspecified: Secondary | ICD-10-CM

## 2022-08-13 DIAGNOSIS — E049 Nontoxic goiter, unspecified: Secondary | ICD-10-CM

## 2022-08-13 DIAGNOSIS — L84 Corns and callosities: Secondary | ICD-10-CM

## 2022-08-13 DIAGNOSIS — I10 Essential (primary) hypertension: Secondary | ICD-10-CM

## 2022-08-13 DIAGNOSIS — G20A1 Dementia due to Parkinson's disease with behavioral disturbance (HCC): Secondary | ICD-10-CM

## 2022-08-13 MED ORDER — RIVASTIGMINE TARTRATE 4.5 MG PO CAP
4.5 mg | ORAL_CAPSULE | Freq: Two times a day (BID) | ORAL | 3 refills | 90.00000 days | Status: AC
Start: 2022-08-13 — End: ?

## 2022-08-13 NOTE — Assessment & Plan Note
Her symptoms are stable. On Cymbalta (duloxetine) and Remeron (mirtazepine). No medication changes were recommended during the current  visit.

## 2022-08-13 NOTE — Assessment & Plan Note
Her symptoms are stable. On Seroquel (quetiapine) and Nuplazid (pimavanserin). No medication changes were recommended during the current  visit.

## 2022-08-13 NOTE — Progress Notes
Alexis Matthews is a 76 y.o. female.    Subjective:             History of Present Illness     Parkinson's Disease Follow-Up Visit    Since my last visit I am: Slightly worse    Symptoms Scale:    Memory problems: Slight, not bothersome  Hallucinations/delusions: Mild, illusions  Depression: Slight, not bothersome  Anxiety: Mild, can be bothersome  Apathy: Slight, some loss of interest  Impulsive behavior: None  Nighttime sleep: No difficulty  Daytime sleepiness: Slight, occasionally  Vivid dreams: Slight, occasional  REM sleep behavior disorder: Moderate, frequently act dreams but no injuries  Restless leg syndrome: None  Pain or muscle cramps: Slight, pain or cramps occasionally  Urination: Slight, go frequently, not bothersome  Constipation: Slight, occasionally  Dizziness or lightheadedness: Slight, occasional on standing  Tiredness/Fatigue: Slight, reduced stamina  Falling: Mild, no more than once a month  Personal care assistance: Moderate, I have some difficulty and need some help daily    Total Score:  Total Score: 22    Assistive Devices:  Assistive devices for getting around: Walker    OFF Time:    OFF time: No    Dyskinesia:    Dyskinesia while awake: No    Employment Status:    Employment: Retired - not due to PD          Review of Systems   Gastrointestinal: Positive for constipation. Negative for abdominal distention, abdominal pain, anal bleeding, blood in stool, diarrhea, nausea, rectal pain and vomiting.   Genitourinary: Positive for enuresis.   Neurological: Positive for speech difficulty and light-headedness.         Objective:         ? acetaminophen SR (TYLENOL 8 HOUR) 650 mg tablet Take one in the am and one at bedtime daily.   ? amLODIPine (NORVASC) 5 mg tablet Take one tablet by mouth daily.   ? benzonatate (TESSALON PERLES) 100 mg capsule Take one capsule by mouth every 6 hours as needed.   ? cholecalciferol (VITAMIN D-3) 400 unit tab tablet Take one tablet by mouth daily.   ? cyanocobalamin (RUBRAMIN) 1,000 mcg/mL injection Inject 1 mL into the muscle every 30 days.   ? duloxetine DR (CYMBALTA) 60 mg capsule TAKE 1 CAPSULE BY MOUTH EVERY DAY   ? famotidine (PEPCID) 20 mg tablet Take two tablets by mouth at bedtime daily.   ? guaiFENesin (ROBITUSSIN) 100 mg/5 mL oral solution Take 15 mL by mouth every 4 hours as needed.   ? ibuprofen (MOTRIN) 400 mg tablet Take one tablet by mouth every 6 hours as needed for Pain. Take with food.   ? levothyroxine (SYNTHROID) 100 mcg tablet Take 1.25 tablets by mouth daily 30 minutes before breakfast. Indications: BRAND NAME ONLY   ? loperamide (IMODIUM A-D) 2 mg capsule Take one capsule by mouth four times daily as needed for Diarrhea.   ? loratadine (CLARITIN) 10 mg tablet Take one tablet by mouth every morning.   ? melatonin 3 mg tab Take 5 mg by mouth at bedtime daily. Takes 5 mg at hs.   ? mirabegron (MYRBETRIQ) 50 mg ER tablet Take one tablet by mouth daily.   ? mirtazapine (REMERON) 15 mg tablet Take one tablet by mouth at bedtime daily.   ? mometasone (NASONEX) 50 mcg/actuation nasal spray Apply two sprays to each nostril as directed daily.   ? omeprazole DR(+) (PRILOSEC) 40 mg capsule Take one capsule by  mouth daily before breakfast.   ? ondansetron (ZOFRAN) 4 mg tablet Take one tablet by mouth every 12 hours as needed for Nausea or Vomiting.   ? pimavanserin (NUPLAZID) 34 mg capsule Take one capsule by mouth daily.   ? QUEtiapine (SEROQUEL) 25 mg tablet Taking one in the am and two at bedtime.   ? rivastigmine tartrate (EXELON) 4.5 mg capsule Take one capsule by mouth twice daily.   ? saccharomyces boulardii (FLORASTOR) 250 mg capsule Take one capsule by mouth daily.     Vitals:    08/13/22 1324   BP: (!) 146/77   BP Source: Arm, Left Upper   Pulse: 86   Temp: 36.6 ?C (97.9 ?F)   TempSrc: Temporal   PainSc: Zero   Weight: 76.2 kg (168 lb)   Height: 167.6 cm (5' 6)     Body mass index is 27.12 kg/m?Marland Kitchen     Physical Exam    EXAMINATION:     ORIENTATION: Alert and Oriented.  Visuospatial/Executive Points: 2/5  Naming Points: 3/3  Attention Points: 4/6  Language Points: 0/3  Abstraction Points: 1/2  Delayed Recall Points: 3/5  Orientation Points: 5/6  Education <or= 12 Years: 0/1  MOCA Score (out of 30): 18     Cranial Nerves:  reduced facial expression and soft voice.        Right Left   Bradkinesia  Mild Mild   Tremor Hands Resting None None    Postural None None    Kinetic None None           Tremor Other: None  Dyskinesia: None  Muscle Strength: Unremarkable  Gait: Needs assistance to walk    Unified Parkinson's Disease Rating Scale: On  Total Mentation Score: 5  Total Activities of Daily Living Score: 21  Total Motor Exam: 41  Total UPDRS Score: 67    Hoehn & Yahr Staging: 4    Schwab & [place] Staging: 40 %    Geriatric Depression Scale: 10 suggesting no depression.    Epworth Sleepiness Scale: 8 suggesting no daytime drowsiness.    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 Diagnosed with Parkinson's disease in 2001. Atypical PD Features: None  Patient has difficulty tolerating Sinemet (carbidopa/levodopa) and dopamine agonists.   09/29/08 UPDRS Motor OFF 36  Underwent bilateral Subthalamic stimulator implants in 2010 for bothersome tremor with marked improvement.   03/24/2012 Mentation Score: 2 Activities of Daily Living Score: 4 Motor Exam:19 Total UPDRS Score: 25    PDQ 39 IMPACT PDQ Total Percent: 6.41 %  04/30/2013  Mentation Score: 3 Activities of Daily Living Score: 13 Motor Exam: 28 Total UPDRS Score: 44   PDQ 39 IMPACT PDQ Total Percent: 5.77 %   05/06/2014 Total Mentation Score: 5 Total Activities of Daily Living Score: 7 Total Motor Exam: 18 Total UPDRS Score: 30  PDQ Total Percent: 17.95 %  05/27/2015 Total Mentation Score: 4 Total Activities of Daily Living Score: 16 Total Motor Exam: 34 Total UPDRS Score: 54   PDQ Total Percent: 33.33 %   Patient was evaluated by Occupational Therapist, Kelli Reiling.    Nausea and fogginess with Sinemet (carbidopa/levodopa) 25/100   01/13/2018 Side effects to Mirapex (pramipexole)   Rytary (carbidopa/levodopa) extended release capsules causing side effects  12/21/2020 Total Mentation Score: 4 Total Activities of Daily Living Score: 16  Total Motor Exam: 35 Total UPDRS Score: 55 PDQ Total Percent: 46.79 %   01/02/2022 PDQ8 Total %: 63   08/13/2022 PDQ Total Percent: 39.1 %      PD Dementia    12/21/2020 MOCA Score (out of 30): 17   07/04/2021 On Exelon (rivastigmine)   08/13/2022 MOCA Score (out of 30): 18      Hallucination    04/15/2019 On Nuplazid (pimavanserin): worse when discontinued  06/29/2019 On Seroquel (quetiapine)       Depression    05/06/2014 Geriatric Depression Scale: 13  12/06/2014 On Paxil (paroxetine)   05/27/2015 Wellbutrin (bupropion) caused nausea  Does not like Paxil (paroxetine)   03/03/2019 Geriatric Depression Scale: 12   08/13/2022 Geriatric Depression Scale: 10          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 No medication changes were recommended during the current  visit. IPG settings were not changed.      PD Dementia  I recommended increasing Exelon (rivastigmine) 4.5 mg twice a day.    Depression  Her symptoms are stable. On Cymbalta (duloxetine) and Remeron (mirtazepine). No medication changes were recommended during the current  visit.      Hallucination  Her symptoms are stable. On Seroquel (quetiapine) and Nuplazid (pimavanserin). No medication changes were recommended during the current  visit.      Patient will follow up in approximately 6 months.

## 2022-08-13 NOTE — Assessment & Plan Note
I recommended increasing Exelon (rivastigmine) 4.5 mg twice a day.

## 2022-08-13 NOTE — Assessment & Plan Note
Her symptoms are stable. No medication changes were recommended during the current  visit. IPG settings were not changed.

## 2022-08-15 ENCOUNTER — Encounter: Admit: 2022-08-15 | Discharge: 2022-08-15 | Payer: MEDICARE

## 2022-08-20 ENCOUNTER — Encounter: Admit: 2022-08-20 | Discharge: 2022-08-20 | Payer: MEDICARE

## 2022-08-20 NOTE — Progress Notes
Pharmacy Benefits Investigation    Medication name: NUPLAZID PO        A grant is available through [employer] for 2024 grant  reenrollment. Contacted the patient to notify them the following information is needed to apply for the grant: patient son will submit during reenrollment window. Spoke with caregiver. They will contact the specialty pharmacy with the information once available. Will follow up in 5 business days if no response.    [employer] reenrollment is 11/29-12/15  [phone] [email or web address])    Peggyann Shoals  Specialty Pharmacy Patient Advocate

## 2022-08-22 ENCOUNTER — Encounter: Admit: 2022-08-22 | Discharge: 2022-08-22 | Payer: MEDICARE

## 2022-08-24 ENCOUNTER — Encounter: Admit: 2022-08-24 | Discharge: 2022-08-24 | Payer: MEDICARE

## 2022-08-25 ENCOUNTER — Encounter: Admit: 2022-08-25 | Discharge: 2022-08-25 | Payer: MEDICARE

## 2022-08-26 ENCOUNTER — Encounter: Admit: 2022-08-26 | Discharge: 2022-08-26 | Payer: MEDICARE

## 2022-08-27 MED FILL — NUPLAZID 34 MG PO CAP: 34 mg | ORAL | 30 days supply | Qty: 30 | Fill #3 | Status: AC

## 2022-09-13 ENCOUNTER — Encounter: Admit: 2022-09-13 | Discharge: 2022-09-13 | Payer: MEDICARE

## 2022-09-17 ENCOUNTER — Encounter: Admit: 2022-09-17 | Discharge: 2022-09-17 | Payer: MEDICARE

## 2022-09-17 MED FILL — NUPLAZID 34 MG PO CAP: 34 mg | ORAL | 30 days supply | Qty: 30 | Fill #4 | Status: AC

## 2022-09-25 ENCOUNTER — Encounter: Admit: 2022-09-25 | Discharge: 2022-09-25 | Payer: MEDICARE

## 2022-09-25 NOTE — Telephone Encounter
Patty called asking for me to call her back. They are needing her ipgs checked.  Called Patty back and obtain ipg reading for [place]. The right ipg is at 2.65 and the Left ipg is at 2.58. Herbert has two [employer] SC. We would like two new SC for her. Her son, Thurmond Butts will be driving, so for any scheduling please call Thurmond Butts as Zalma will need his help.  Gabrianna was a patient of Dr Reyes Ivan and has not met a neurosurgery as of yet. Let me know if you have any questions. Will send settings and impedances once a surgeon has been assigned.

## 2022-09-28 ENCOUNTER — Encounter: Admit: 2022-09-28 | Discharge: 2022-09-28 | Payer: MEDICARE

## 2022-09-28 DIAGNOSIS — Z4542 Encounter for adjustment and management of neuropacemaker (brain) (peripheral nerve) (spinal cord): Secondary | ICD-10-CM

## 2022-10-02 ENCOUNTER — Ambulatory Visit: Admit: 2022-10-02 | Discharge: 2022-10-02 | Payer: MEDICARE

## 2022-10-02 ENCOUNTER — Encounter: Admit: 2022-10-02 | Discharge: 2022-10-02 | Payer: MEDICARE

## 2022-10-02 DIAGNOSIS — Z4542 Encounter for adjustment and management of neuropacemaker (brain) (peripheral nerve) (spinal cord): Secondary | ICD-10-CM

## 2022-10-05 ENCOUNTER — Encounter: Admit: 2022-10-05 | Discharge: 2022-10-05 | Payer: MEDICARE

## 2022-10-05 NOTE — Telephone Encounter
Neurosurgery pre cert, Randell Patient called today (ext [phone] requesting a print out of this patients information prior to ipg change. She is attempting to authorize her surgery. She is asking for the most current note and any phone note we might use to document the ipg charge when its checked and its low.  Was able to find [facility] phone number and called her. Told her all the information should be in the patients chart. We pulled up this chart and on 08/13/22 showed her the nurses note and where the numbers were documented. I then explained that they may have a phone note sometimes. We suggest she look a the the most present encounters on then encounter tab and work her way back from there. If no phone note is seen then look for the first nurse encounter.  She thanked me for calling. I provided our direct phone number for her to call our office if she needs to.

## 2022-10-08 ENCOUNTER — Encounter: Admit: 2022-10-08 | Discharge: 2022-10-08 | Payer: MEDICARE

## 2022-10-08 NOTE — Progress Notes
Pharmacy Benefits Investigation    Medication name: NUPLAZID PO        A grant is available through [employer]. A grant was obtained and will provide the patient through 09/24/2023.    [place] Patient Advocate

## 2022-10-10 ENCOUNTER — Encounter: Admit: 2022-10-10 | Discharge: 2022-10-10 | Payer: MEDICARE

## 2022-10-10 NOTE — Progress Notes
Contacted [facility] to refill their medication(s) NUPLAZID 34 MG PO CAP.    The refill was not processed. The patient requested to no longer receive refill reminder calls. They will be removed from the pharmacy refill management program and will no longer receive refill reminders for any medications. The patient may be re-enrolled in the pharmacy refill management program at any time by contacting the pharmacy. Ambulatory pharmacist notified.    [place]  [phone]

## 2022-10-16 ENCOUNTER — Encounter: Admit: 2022-10-16 | Discharge: 2022-10-16 | Payer: MEDICARE

## 2022-10-16 ENCOUNTER — Ambulatory Visit: Admit: 2022-10-16 | Discharge: 2022-10-16 | Payer: MEDICARE

## 2022-10-16 DIAGNOSIS — R011 Cardiac murmur, unspecified: Secondary | ICD-10-CM

## 2022-10-16 DIAGNOSIS — R109 Unspecified abdominal pain: Secondary | ICD-10-CM

## 2022-10-16 DIAGNOSIS — C801 Malignant (primary) neoplasm, unspecified: Secondary | ICD-10-CM

## 2022-10-16 DIAGNOSIS — S72009A Fracture of unspecified part of neck of unspecified femur, initial encounter for closed fracture: Secondary | ICD-10-CM

## 2022-10-16 DIAGNOSIS — K219 Gastro-esophageal reflux disease without esophagitis: Secondary | ICD-10-CM

## 2022-10-16 DIAGNOSIS — G20A1 Parkinson disease: Secondary | ICD-10-CM

## 2022-10-16 DIAGNOSIS — M4800 Spinal stenosis, site unspecified: Secondary | ICD-10-CM

## 2022-10-16 DIAGNOSIS — E049 Nontoxic goiter, unspecified: Secondary | ICD-10-CM

## 2022-10-16 DIAGNOSIS — I10 Essential (primary) hypertension: Secondary | ICD-10-CM

## 2022-10-16 DIAGNOSIS — F419 Anxiety disorder, unspecified: Secondary | ICD-10-CM

## 2022-10-16 DIAGNOSIS — T50905A Adverse effect of unspecified drugs, medicaments and biological substances, initial encounter: Secondary | ICD-10-CM

## 2022-10-16 DIAGNOSIS — R002 Palpitations: Secondary | ICD-10-CM

## 2022-10-16 DIAGNOSIS — Z974 Presence of external hearing-aid: Secondary | ICD-10-CM

## 2022-10-16 DIAGNOSIS — R2689 Other abnormalities of gait and mobility: Secondary | ICD-10-CM

## 2022-10-16 DIAGNOSIS — L84 Corns and callosities: Secondary | ICD-10-CM

## 2022-10-16 NOTE — Unmapped
[facility] Anesthesia Pre-Procedure Evaluation    Name: Alexis Matthews      MRN: [id number]     DOB: Jun 21, 1946     Age: 77 y.o.     Sex: female   _________________________________________________________________________     Procedure Info:   Procedure Information       Date/Time: 10/29/22 1415    Procedures:       INSERTION/ REPLACEMENT CRANIAL STIMULATOR WITH CONNECTION SINGLE ELECTRODE ARRAY - DEEP BRAIN STIMULATOR. [employer] SC to [facility]. PD (Bilateral) - 45 mins      INSERTION/ REPLACEMENT CRANIAL STIMULATOR WITH CONNECTION SINGLE ELECTRODE ARRAY (Bilateral)    Location: CA3 OR01 / CA3 OR/Periop    Surgeons: Evaristo Bury, MD            Physical Assessment  Vital Signs (last filed in past 24 hours):         Patient History   Allergies   Allergen Reactions    Latex SWELLING    Primidone MENTAL STATUS CHANGES     Does not like the feeling it gives her feels like I'm outside my body.    Celexa [Citalopram] STOMACH UPSET    Flu Vaccine [Influenza Virus Vaccines] NAUSEA AND VOMITING    Lisinopril SEE COMMENTS     Hypotension and stomach cramps    Neurontin [Gabapentin] DIZZINESS     Falls      Seasonal Allergies SNEEZING        Current Medications    Medication Directions   acetaminophen SR (TYLENOL 8 HOUR) 650 mg tablet Take one in the am and one at bedtime daily.   amLODIPine (NORVASC) 5 mg tablet Take one tablet by mouth daily.   benzonatate (TESSALON PERLES) 100 mg capsule Take one capsule by mouth every 6 hours as needed.   cholecalciferol (VITAMIN D-3) 400 unit tab tablet Take one tablet by mouth daily.   cyanocobalamin (RUBRAMIN) 1,000 mcg/mL injection Inject 1 mL into the muscle every 30 days.   duloxetine DR (CYMBALTA) 60 mg capsule TAKE 1 CAPSULE BY MOUTH EVERY DAY   famotidine (PEPCID) 20 mg tablet Take two tablets by mouth at bedtime daily.   guaiFENesin (ROBITUSSIN) 100 mg/5 mL oral solution Take 15 mL by mouth every 4 hours as needed.   ibuprofen (MOTRIN) 400 mg tablet Take one tablet by mouth every 6 hours as needed for Pain. Take with food.   levothyroxine (SYNTHROID) 100 mcg tablet Take 1.25 tablets by mouth daily 30 minutes before breakfast. Indications: BRAND NAME ONLY   loperamide (IMODIUM A-D) 2 mg capsule Take one capsule by mouth four times daily as needed for Diarrhea.   loratadine (CLARITIN) 10 mg tablet Take one tablet by mouth every morning.   melatonin 3 mg tab Take 5 mg by mouth at bedtime daily. Takes 5 mg at hs.   mirabegron (MYRBETRIQ) 50 mg ER tablet Take one tablet by mouth daily.   mirtazapine (REMERON) 15 mg tablet Take one tablet by mouth at bedtime daily.   mometasone (NASONEX) 50 mcg/actuation nasal spray Apply two sprays to each nostril as directed daily.   omeprazole DR(+) (PRILOSEC) 40 mg capsule Take one capsule by mouth daily before breakfast.   ondansetron (ZOFRAN) 4 mg tablet Take one tablet by mouth every 12 hours as needed for Nausea or Vomiting.   pimavanserin (NUPLAZID) 34 mg capsule Take one capsule by mouth daily.   QUEtiapine (SEROQUEL) 25 mg tablet Taking one in the am and two at bedtime.   rivastigmine tartrate (  EXELON) 4.5 mg capsule Take one capsule by mouth twice daily.   saccharomyces boulardii (FLORASTOR) 250 mg capsule Take one capsule by mouth daily.       Review of Systems/Medical History      Patient summary reviewed  Nursing notes reviewed  Pertinent labs reviewed    PONV Screening: Non-smoker and Female sex    History of anesthetic complications ( hx of belligerance after anesthesia, most recently she did not have any problems)    No family history of anesthetic complications      Airway - negative        No TMJ      Pulmonary       Not a current smoker ( passive smoke exposure x 25 years)        No indications/hx of asthma      no COPD         Recent URI, resolved      Cardiovascular       Recent diagnostic studies:          echocardiogram            Echocardiogram 12/2015  1.  Normal left ventricular systolic function, with an estimated ejection fraction of 65%.   2.  Normal right ventricular size and systolic function.   3.  No significant valvular abnormalities.         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)        No hyperlipidemia      No orthopnea      No syncope      Hx of neurogenic orthostatic hypotension - periodic episodes of light-headedness with ambulation NOT associated with chest tightness/pain or shortness of breath          GI/Hepatic/Renal             GERD ( on H2 blocker), well controlled        No liver disease:         No renal disease:           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 Erline Levine, last OV 06/11/18, recommended follow up in 6 months    Hx of multiple falls      Musculoskeletal         No neck pain      Back pain ( spinal stenosis)      Arthritis:         Endocrine/Other       No diabetes        Hypothyroidism ( on levothyroxine)      No anemia      No blood dyscrasia        No malignancy        Hx of enlarged thyroid, s/p radioactive iodine treatment    Constitution - negative       Physical Exam    Airway Findings      Mallampati: II      TM distance: >3 FB      Neck ROM: full      Mouth opening: good      Upper Lip Bite Test: 1    Dental Findings: Negative      Cardiovascular Findings:         Other findings: Murmur (Documented since 2015)    Pulmonary Findings:       Breath sounds clear to auscultation.    Constitutional findings:       No acute distress      Well-developed       Diagnostic Tests  Hematology:   Lab Results   Component Value Date    HGB 11.7 08/20/2021    HCT 34.9 08/20/2021    PLTCT 194 08/20/2021    WBC 9.4 08/20/2021    NEUT 73 08/20/2021    ANC 6.97 08/20/2021    ALC 1.30 08/20/2021    MONA 9 08/20/2021    AMC 0.83 08/20/2021    EOSA 3 08/20/2021    ABC 0.07 08/20/2021    MCV 78.5 08/20/2021    MCH 26.3 08/20/2021    MCHC 33.5 08/20/2021    MPV 8.6 08/20/2021    RDW 15.9 08/20/2021         General Chemistry:   Lab Results   Component Value Date    NA 141 08/20/2021    K 4.6 08/20/2021    CL 107 08/20/2021    CO2 25 08/20/2021    GAP 9 08/20/2021    BUN 18 08/20/2021    CR 1.08 08/20/2021    GLU 93 08/20/2021    CA 9.5 08/20/2021    ALBUMIN 4.0 08/19/2021    MG 2.2 01/19/2020    TOTBILI 0.3 08/19/2021      Coagulation:   Lab Results   Component Value Date    PT 11.8 08/19/2021    INR 1.0 08/19/2021       PAC Plan    Interview: Clinic Interview    ASA Score: 3    Anesthesia Options Discussed: MAC and General                          PAC RISK ASSESSMENTS:   *D[facility] Activity Status Index (DASI): 12.45, Calculated METS: 4.27 (score < 25 correlates with increased risk for death, MI, and moderate to severe complications, max score 57)  *STOP-BANG Score: 3 (total 5-8 high risk for OSA, 3-4 with HCO3 >28 also high risk. OSA associated with greater than twice the odds for respiratory failure, cardiac events, ICU  admission, and difficult intubation)         Alerts

## 2022-10-16 NOTE — Pre-Anesthesia Patient Instructions
PREPROCEDURE INFORMATION    Arrival at the hospital  [facility]  [address]  [place], [place] [address]    Park in the P5 parking garage located at [address] [place], [place] [address].   If parking in the P5 garage, take the east elevators in the parking garage to the second level and walk to the entrance of the [employer].    Enter through the 1st floor main entrance and check in with Information Desk.     You will receive a call with your surgery arrival time between 2:30pm and 4:30pm the last business day before your procedure.  If you do not receive a call, please call [phone] before 4:30pm or [phone] after 4:30pm.    Eating or drinking before surgery  Nothing to eat after 11:00pm the night before your surgery including gum, mints and candy. You may have clear liquids up to 2 hours before your surgery time.     If you have received specific instructions from your surgeon, please follow those.     Clear Liquid Examples:   Water  Clear juice - Apple or cranberry (no pulp or orange juice) - If diabetic blood sugar must be <200  Coffee and tea with or without sugar (no cream)   Sports drinks - Powerade/Gatorade   Soda   Bowel Prep solutions only if ordered by your surgeon      Planning transportation for outpatient procedure  For your safety, you will need to arrange for a responsible ride/person to accompany you home due to sedation or anesthesia with your procedure.  An [employer], taxi or other public transportation driver is not considered a responsible person to accompany you home.    Bath/Shower Instructions  Take a bath or shower using the special soap given to you in PAC. Use half the bottle the night before, and the other half the morning of your procedure. Use clean towels with each bath or shower.  Put on clean clothes after bath or shower.  Avoid using lotion and oils.  If you are having surgery above the waist, wear a shirt that fastens up the front.  Sleep on clean sheets if bath or shower is done the night before procedure.    Morning of your procedure:  Brush your teeth and tongue  Do not smoke, vape, chew or user any tobacco products.  Do not shave the area where you will have surgery.  Remove nail polish, makeup and all jewelry (including piercings) before coming to the hospital.  Dress in clean, loose, comfortable clothing.    Valuables  Leave money, credit cards, jewelry, and any other valuables at home. If medications are being filled and picked up at a [facility] pharmacy, payment will be needed. The [facility] is not responsible for the loss or breakage of personal items.    What to bring to the hospital  ID/Insurance card  Medical Device card  Official documents for legal guardianship  Copy of your Living Will, Advanced Directives, and/or Durable Power of Attorney. If you have these documents, please bring them to the admissions office on the day of your surgery to be scanned into your records.  Do not bring medications from home unless instructed by a pharmacist.  CPAP/BiPAP machine (including all supplies)  Walker, cane, or motorized scooter  Cases for glasses/hearing aids/contact lens (bring solutions for contacts)  Stimulator remote     Notify us at [employer]: [phone] on the day  of your procedure if:  You need to cancel your procedure.  You are going to be late.    Notify your surgeon if:  You become ill with a cough, fever, sore throat, nausea, vomiting or flu-like symptoms.  You have any open wounds/sores that are red, painful, draining, or are new since you last saw the doctor.  You need to cancel your procedure.    Preparing to get your medications at discharge  Your surgeon may prescribe you medications to take after your procedure.  If you like the convenience of having your medications filled here at [facility], please do the following:  Go to [facility] pharmacy after your [facility] appointment to put a credit card on file.  Call [facility] pharmacy at [phone] (Monday-Friday 7am-9pm or Saturday and Sunday 9am-5pm) to put a credit card on file.  Bring a credit card or cash on the day of your procedure- please leave with a family member rather than bringing it into the preop area.    Current Visitor Policy:  Visitors must be free of fever and symptoms to be in our facilities.  No more than 2 visitors per patient are allowed.  Additional guidelines may vary, based on patient care area or patient's condition.  Patients in semiprivate rooms may have visitors, but visits should be coordinated so only two total visitors are in a room at a time due to space limitations.  Children younger than age 66 are allowed to visit inpatients.    Thank you for participating in your Preoperative Assessment visit today.    If you have any changes to your health or hospitalizations between now and your surgery, please call us at [phone].    Instructions given to patient via: verbal and printed copy

## 2022-10-17 ENCOUNTER — Encounter: Admit: 2022-10-17 | Discharge: 2022-10-17 | Payer: MEDICARE

## 2022-10-17 MED FILL — NUPLAZID 34 MG PO CAP: 34 mg | ORAL | 30 days supply | Qty: 30 | Fill #5 | Status: AC

## 2022-10-29 ENCOUNTER — Encounter: Admit: 2022-10-29 | Discharge: 2022-10-29 | Payer: MEDICARE

## 2022-10-29 ENCOUNTER — Ambulatory Visit: Admit: 2022-10-29 | Discharge: 2022-10-29 | Payer: MEDICARE

## 2022-10-29 MED ORDER — FENTANYL CITRATE (PF) 50 MCG/ML IJ SOLN
INTRAVENOUS | 0 refills | Status: DC
Start: 2022-10-29 — End: 2022-10-29

## 2022-10-29 MED ORDER — PROPOFOL 10 MG/ML IV EMUL 20 ML (INFUSION)(AM)(OR)
INTRAVENOUS | 0 refills | Status: DC
Start: 2022-10-29 — End: 2022-10-29
  Administered 2022-10-29: 21:00:00 60 ug/kg/min via INTRAVENOUS

## 2022-10-29 MED ORDER — CEFAZOLIN 2 GRAM IV SOLR
INTRAVENOUS | 0 refills | Status: DC
Start: 2022-10-29 — End: 2022-10-29

## 2022-10-29 MED ORDER — ACETAMINOPHEN 1,000 MG/100 ML (10 MG/ML) IV SOLN
INTRAVENOUS | 0 refills | Status: DC
Start: 2022-10-29 — End: 2022-10-29

## 2022-10-29 MED ADMIN — SODIUM CHLORIDE 0.9 % IR SOLN [11403]: 1000 mL | @ 21:00:00 | Stop: 2022-10-29 | NDC 00338004804

## 2022-10-29 MED ADMIN — VANCOMYCIN 1,000 MG IV SOLR [8442]: 2 mL | @ 21:00:00 | Stop: 2022-10-29 | NDC 00409653311

## 2022-10-29 MED ADMIN — SODIUM CHLORIDE 0.9 % IV SOLP [27838]: 1000 mL | INTRAVENOUS | @ 20:00:00 | Stop: 2022-10-29 | NDC 00338004904

## 2022-10-29 MED ADMIN — GENTAMICIN 40 MG/ML IJ SOLN [3426]: 2 mL | @ 21:00:00 | Stop: 2022-10-29 | NDC 00409120713

## 2022-10-29 MED ADMIN — CEFAZOLIN 1 GRAM IJ SOLR [1445]: 1000 mL | @ 21:00:00 | Stop: 2022-10-29 | NDC 00143992490

## 2022-10-31 ENCOUNTER — Encounter: Admit: 2022-10-31 | Discharge: 2022-10-31 | Payer: MEDICARE

## 2022-10-31 DIAGNOSIS — M4800 Spinal stenosis, site unspecified: Secondary | ICD-10-CM

## 2022-10-31 DIAGNOSIS — S72009A Fracture of unspecified part of neck of unspecified femur, initial encounter for closed fracture: Secondary | ICD-10-CM

## 2022-10-31 DIAGNOSIS — C801 Malignant (primary) neoplasm, unspecified: Secondary | ICD-10-CM

## 2022-10-31 DIAGNOSIS — T50905A Adverse effect of unspecified drugs, medicaments and biological substances, initial encounter: Secondary | ICD-10-CM

## 2022-10-31 DIAGNOSIS — Z974 Presence of external hearing-aid: Secondary | ICD-10-CM

## 2022-10-31 DIAGNOSIS — F419 Anxiety disorder, unspecified: Secondary | ICD-10-CM

## 2022-10-31 DIAGNOSIS — L84 Corns and callosities: Secondary | ICD-10-CM

## 2022-10-31 DIAGNOSIS — R002 Palpitations: Secondary | ICD-10-CM

## 2022-10-31 DIAGNOSIS — K219 Gastro-esophageal reflux disease without esophagitis: Secondary | ICD-10-CM

## 2022-10-31 DIAGNOSIS — E049 Nontoxic goiter, unspecified: Secondary | ICD-10-CM

## 2022-10-31 DIAGNOSIS — R109 Unspecified abdominal pain: Secondary | ICD-10-CM

## 2022-10-31 DIAGNOSIS — R011 Cardiac murmur, unspecified: Secondary | ICD-10-CM

## 2022-10-31 DIAGNOSIS — I10 Essential (primary) hypertension: Secondary | ICD-10-CM

## 2022-10-31 DIAGNOSIS — G20A1 Parkinson disease: Secondary | ICD-10-CM

## 2022-10-31 DIAGNOSIS — R2689 Other abnormalities of gait and mobility: Secondary | ICD-10-CM

## 2022-11-01 ENCOUNTER — Encounter: Admit: 2022-11-01 | Discharge: 2022-11-01 | Payer: MEDICARE

## 2022-11-05 ENCOUNTER — Encounter: Admit: 2022-11-05 | Discharge: 2022-11-05 | Payer: MEDICARE

## 2022-11-07 ENCOUNTER — Encounter: Admit: 2022-11-07 | Discharge: 2022-11-07 | Payer: MEDICARE

## 2022-11-09 ENCOUNTER — Encounter: Admit: 2022-11-09 | Discharge: 2022-11-09 | Payer: MEDICARE

## 2022-11-09 NOTE — Progress Notes
Pharmacy Medication Reassessment    The patient's caregiver Rosey Bath, RN at facility) participated on the patient's behalf. All references to the patient herein were completed with the caregiver on the patient's behalf.    Indication/Regimen  The regimen of PIMAVANSERIN 34 MG PO CAP indefinitely is appropriate for Alexis Matthews who has Parkinson disease.    Renal dose adjustments are not required. Hepatic dose adjustments are not required. Dose titration is not required.    The patient has the ability to self-administer the medication(s).    Baseline Characteristics  Additional medications for this indication: quetiapine 25 mg qAM and 50 mg qPM    Therapeutic Goals and Monitoring  The goal of therapy is reduction in severity and/or frequency of hallucinations and/or delusions.    Symptom Control  Symptom assessment: stable    The patient is making progress toward achieving their therapeutic goals. The plan is to continue current therapy.    Past Medical History and Comorbidities  Patient Active Problem List   Diagnosis    Parkinson's disease without dyskinesia or fluctuating manifestations with Bilateral STN Implants    Hypothyroidism    Osteoarthritis    Palpitation    Hypertension    Dysarthria    Hip joint replacement status    Aortic root enlargement (HCC)    Weakness of voice    Anxiety    Neurogenic orthostatic hypotension (HCC)    Depression    At risk for falls    Closed displaced fracture of neck of second metacarpal bone of right hand    Metatarsalgia of left foot    Hallucination    Closed burst fracture of lumbar vertebra (HCC)    PD Dementia    Syncope and collapse    Fall at home, initial encounter     Additional comorbidities: no    Labs and Diagnostic Tests  No recent labs.     Allergies  Allergies   Allergen Reactions    Latex SWELLING    Adhesive RASH     IOBAN (drape)    Primidone MENTAL STATUS CHANGES     Does not like the feeling it gives her feels like I'm outside my body.    Celexa [Citalopram] STOMACH UPSET    Flu Vaccine [Influenza Virus Vaccines] NAUSEA AND VOMITING    Lisinopril SEE COMMENTS     Hypotension and stomach cramps    Neurontin [Gabapentin] DIZZINESS     Falls      Seasonal Allergies SNEEZING     Immunizations  Vaccine history was reviewed with the patient. Education was not provided on the importance of completing vaccines. It was not provided because patient is up to date on vaccinations and education is not needed.    Immunization History   Administered Date(s) Administered    COVID-19 (PFIZER), mRNA vacc, 30 mcg/0.3 mL (PF) 10/12/2019, 10/29/2019, 07/21/2020    COVID-19 Bivalent (88YR+)(PFIZER), mRNA vacc, 72mcg/0.3mL 06/14/2021       Home Medications    Medication Sig   acetaminophen SR (TYLENOL 8 HOUR) 650 mg tablet Take one tablet by mouth three times daily. NTE 3 gram APAP in 24 hours.   amLODIPine (NORVASC) 5 mg tablet Take one tablet by mouth daily.   cetirizine (ZYRTEC) 10 mg tablet Take one tablet by mouth every morning.   cyanocobalamin (RUBRAMIN) 1,000 mcg/mL injection Inject 1 mL into the muscle every 30 days.   duloxetine DR (CYMBALTA) 30 mg capsule Take one capsule by mouth daily. (Total daily dose =  90mg )   duloxetine DR (CYMBALTA) 60 mg capsule Take one capsule by mouth daily. (Total daily dose = 90mg )   famotidine (PEPCID) 40 mg tablet Take one tablet by mouth at bedtime daily.   guaiFENesin (ROBITUSSIN) 100 mg/5 mL oral solution Take 5 mL by mouth every 6 hours as needed.   levothyroxine (SYNTHROID) 125 mcg tablet Take one tablet by mouth daily 30 minutes before breakfast. Indications: BRAND NAME ONLY   melatonin 5 mg tablet Take one tablet by mouth at bedtime daily.   mirabegron (MYRBETRIQ) 50 mg ER tablet Take one tablet by mouth daily.   mirtazapine (REMERON) 15 mg tablet Take one tablet by mouth at bedtime daily.   mometasone (NASONEX) 50 mcg/actuation nasal spray Apply one spray to two sprays to each nostril as directed twice daily. Indications: chronic nasal congestion   omeprazole DR(+) (PRILOSEC) 40 mg capsule Take one capsule by mouth daily before breakfast.   ondansetron (ZOFRAN) 4 mg tablet Take one tablet by mouth every 12 hours as needed for Nausea or Vomiting.   pimavanserin (NUPLAZID) 34 mg capsule Take one capsule by mouth daily.  Patient taking differently: Take one capsule by mouth at bedtime daily.   QUEtiapine (SEROQUEL) 25 mg tablet Taking one in the am and two at bedtime.   rivastigmine tartrate (EXELON) 4.5 mg capsule Take one capsule by mouth twice daily.   Medication list reconciled with facility MAR.    Medication Reconciliation  Medication history and reconciliation were performed (including prescription medications, supplements, over the counter, and herbal products). The medication list was updated and the patient's current medication list is included. The patient was instructed to speak with their health care provider before starting any new drug, including prescription or over the counter, natural / herbal products, or vitamins.    Drug Interactions    Drug-Drug Interactions  Drug-drug interactions were evaluated. There were clinically significant drug-drug interactions. The following drug-drug interactions were identified: Nuplazid and quetiapine and will be managed by EKG as clinically indicated to monitor QTc prolongation. Last EKG 12/2020, QTc 445.     Drug-Food Interactions  Drug-food interactions were evaluated. There are clinically significant drug-food interactions.    Nuplazid should be taken with or without food. Grapefruit/grapefruit juice should be avoided.    Adverse Drug Reactions  Adverse drug reactions were reviewed with the patient.    Significant adverse drug reaction(s) were not identified.    Side effect(s) were not reported.    Adherence  Refill and adherence history were reviewed with the patient. The patient was educated on the importance of adherence.    Patient is adherent with refills: yes  Patient is meeting refill adherence goal: yes    Patient reported 0 missed doses over the past 7 days.  Significance of missed doses: NA - no missed doses   Patient is meeting reported adherence goal.    Safety Precautions    Risk Evaluation and Mitigation (REMS) Assessment: REMS is not required for this medication.    Safety precautions were addressed and discussed with the patient as applicable.    Contraindications: DYLAN SORRENTINO does not have contraindications to this medication.      Pregnancy Status: Female, not of child-bearing potential, education not applicable.    Medication Education  Counseling was not completed because patient was previously educated and did not require additional counseling.    NYALISE BANUELOS was given the opportunity to ask questions but did not have any questions at the time. Patient  was reminded of the refill process and encouraged to call with questions. The monitoring and follow-up plan was discussed with the patient. The patient was instructed to contact their health care provider if their symptoms or health problems do not get better or if they become worse. The patient should contact the specialty pharmacy at [phone] if they have any questions or concerns regarding their medication therapy. The patient verbalized acceptance and understanding.    Follow-up Plan  The patient will be reassessed within 1 year.    The medication(s) will be shipped from [facility].    Luanne Bras, PHARMD, BCACP

## 2022-11-12 ENCOUNTER — Encounter: Admit: 2022-11-12 | Discharge: 2022-11-12 | Payer: MEDICARE

## 2022-11-12 ENCOUNTER — Ambulatory Visit: Admit: 2022-11-12 | Discharge: 2022-11-13 | Payer: MEDICARE

## 2022-11-12 DIAGNOSIS — R2689 Other abnormalities of gait and mobility: Secondary | ICD-10-CM

## 2022-11-12 DIAGNOSIS — L84 Corns and callosities: Secondary | ICD-10-CM

## 2022-11-12 DIAGNOSIS — R109 Unspecified abdominal pain: Secondary | ICD-10-CM

## 2022-11-12 DIAGNOSIS — M4800 Spinal stenosis, site unspecified: Secondary | ICD-10-CM

## 2022-11-12 DIAGNOSIS — K219 Gastro-esophageal reflux disease without esophagitis: Secondary | ICD-10-CM

## 2022-11-12 DIAGNOSIS — Z974 Presence of external hearing-aid: Secondary | ICD-10-CM

## 2022-11-12 DIAGNOSIS — T50905A Adverse effect of unspecified drugs, medicaments and biological substances, initial encounter: Secondary | ICD-10-CM

## 2022-11-12 DIAGNOSIS — I10 Essential (primary) hypertension: Secondary | ICD-10-CM

## 2022-11-12 DIAGNOSIS — R011 Cardiac murmur, unspecified: Secondary | ICD-10-CM

## 2022-11-12 DIAGNOSIS — Z4889 Encounter for other specified surgical aftercare: Secondary | ICD-10-CM

## 2022-11-12 DIAGNOSIS — C801 Malignant (primary) neoplasm, unspecified: Secondary | ICD-10-CM

## 2022-11-12 DIAGNOSIS — E049 Nontoxic goiter, unspecified: Secondary | ICD-10-CM

## 2022-11-12 DIAGNOSIS — R002 Palpitations: Secondary | ICD-10-CM

## 2022-11-12 DIAGNOSIS — S72009A Fracture of unspecified part of neck of unspecified femur, initial encounter for closed fracture: Secondary | ICD-10-CM

## 2022-11-12 DIAGNOSIS — G20A1 Parkinson disease: Secondary | ICD-10-CM

## 2022-11-12 DIAGNOSIS — F419 Anxiety disorder, unspecified: Secondary | ICD-10-CM

## 2022-11-12 NOTE — Progress Notes
Subjective:       History of Present Illness  Alexis Matthews is a 77 y.o. female with a PMH including Parkinson's disease.     Patient underwent bilateral STN DBS implantation in 2010. It was noted recently that both her IPGs are in need of replacement.   On 10/29/22, patient underwent bilateral IPG replacement with Dr. Hyacinth Meeker.    She presents today for her 2 week follow up. She is accompanied with an adult female. Patient states she is doing well. She denies having postsurgical complications or pain.   Overall, she feels back to her baseline. They have been washing her incisions regularly at the facility where she resides.   She denies having any other questions or concerns at this time.       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     Overview Note:     12/21/2020 MOCA Score (out of 30): 17   07/04/2021 On Exelon (rivastigmine)   08/13/2022 MOCA Score (out of 30): 18       Closed burst fracture of lumbar vertebra (HCC) 01/29/2020    Hallucination 03/03/2019     Overview Note:     04/15/2019 On Nuplazid (pimavanserin): worse when discontinued  06/29/2019 On Seroquel (quetiapine)        Metatarsalgia of left foot 04/01/2015    Closed displaced fracture of neck of second metacarpal bone of right hand 12/14/2014    At risk for falls 12/06/2014     Overview Note:     04/15/2019 Falls Risk Score:   13 Timed Up and Go with Pushing: 26 Seconds(walker)     06/29/2019 Timed Up and Go with Pushing: 17 Seconds(walker)   01/05/2020 Falls Risk Score:   10 Timed Up and Go with Pushing: 28 Seconds([employer] walker)     12/21/2020 Falls Risk Score:   8 Timed Up and Go with Pushing: 32 Seconds (walker)         Depression 05/06/2014     Overview Note:     05/06/2014 Geriatric Depression Scale: 13  12/06/2014 On Paxil (paroxetine)   05/27/2015 Wellbutrin (bupropion) caused nausea  Does not like Paxil (paroxetine)   03/03/2019 Geriatric Depression Scale: 12   08/13/2022 Geriatric Depression Scale: 10       Anxiety 05/04/2014     Overview Note:     On lorazepam      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  Weakness of voice 11/19/2013     Overview Note:     Secondary to Parkinson's, has had therapy to project voice.      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  3/10 echo from [facility] reported with mild AI, nl EF      Hypertension      Overview Note:     7/13 spoke to PCP. The BPs are very labile, very high at night, mostly orthostatic with symptoms during the day 2/2 low readings      Parkinson's disease without dyskinesia or fluctuating manifestations with Bilateral STN Implants 12/14/2008     Overview Note:     Symptoms began in 2001, initial symptoms was right hand tremor. Diagnosed with Parkinson's disease in 2001. Atypical PD Features: None  Patient has difficulty tolerating Sinemet (carbidopa/levodopa) and dopamine agonists.   09/29/08 UPDRS Motor OFF 36  Underwent bilateral Subthalamic stimulator implants in 2010 for bothersome tremor with marked improvement.   03/24/2012 Mentation Score: 2 Activities of Daily Living Score: 4 Motor Exam:19 Total UPDRS Score: 25    PDQ 39 IMPACT PDQ Total Percent: 6.41 %  04/30/2013  Mentation Score: 3 Activities of Daily Living Score: 13 Motor Exam: 28 Total UPDRS Score: 44   PDQ 39 IMPACT PDQ Total Percent: 5.77 %   05/06/2014 Total Mentation Score: 5 Total Activities of Daily Living Score: 7 Total Motor Exam: 18 Total UPDRS Score: 30  PDQ Total Percent: 17.95 %  05/27/2015 Total Mentation Score: 4 Total Activities of Daily Living Score: 16 Total Motor Exam: 34 Total UPDRS Score: 54   PDQ Total Percent: 33.33 %   Patient was evaluated by Occupational Therapist, Kelli Reiling.    Nausea and fogginess with Sinemet (carbidopa/levodopa) 25/100   01/13/2018 Side effects to Mirapex (pramipexole)   Rytary (carbidopa/levodopa) extended release capsules causing side effects  12/21/2020 Total Mentation Score: 4 Total Activities of Daily Living Score: 16  Total Motor Exam: 35 Total UPDRS Score: 55 PDQ Total Percent: 46.79 %   01/02/2022 PDQ8 Total %: 63   08/13/2022 PDQ Total Percent: 39.1 %       Hypothyroidism 12/14/2008    Osteoarthritis 12/14/2008        Medical History:   Diagnosis Date    Abdominal pain 2022    Acid reflux     Adverse drug reaction     Anxiety and depression     Balance problem 2016    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     Enlarged thyroid     Foot callus 2014    left    Heart murmur     Hip fracture (HCC)     Hypertension     Palpitation     Parkinson disease     Spinal stenosis     Wears hearing aid in both ears        Surgical History:   Procedure Laterality Date    LAPAROSCOPY  1973    GASTRIC FUNDOPLICATION  2008    DEEP BRAIN STIMULATOR PLACEMENT  2010    STN implants    PR OPEN TX RIB FRACTURE W/INT FIX UNI 1-2 RIBS Left Feb 2017    REMOVAL OF CURRENT SINGLE CHANNEL DEEP BRAIN STIMULATION INTERMITTENT PULSE GENERATORS, IMPLANTATION  OF NEW SINGLE CHANNEL DEEP BRAIN STIMULATION INTERMITTENT PULSE GENERATORS, RIGHT CHEST AND LEFT CHEST Bilateral 10/03/2018    Performed by Alvino Blood, MD at [facility]    INSERTION/ REPLACEMENT CRANIAL STIMULATOR WITH CONNECTION SINGLE ELECTRODE ARRAY - DEEP BRAIN STIMULATOR. [employer] SC to [facility]. PD Bilateral 10/29/2022    Performed by Evaristo Bury, MD at [facility]    INSERTION/ REPLACEMENT CRANIAL STIMULATOR WITH CONNECTION SINGLE ELECTRODE ARRAY Bilateral 10/29/2022    Performed by Evaristo Bury, MD at [facility]    INTERTROCHANTERIC HIP FRACTURE SURGERY  Jule 12 th, 2014    right hip    SURGERY      Battery replacement in nerve stimulator       Family History   Problem Relation Age of Onset    Diabetes Mother     Hypertension Mother     Heart Failure Father     Coronary Artery Disease Father     Hypertension Father     High Cholesterol Father     Stroke Father     Other Brother         bronchiole cancer    Inflammatory Bowel Disease Brother         Crohn's    Coronary Artery Disease Paternal Grandfather     GI Cancer Brother         esopahgeal cancer    Cancer-Colon Neg Hx     Colon Polyps Neg Hx     Celiac Disease Neg Hx     Parkinson's  Neg Hx     Dementia Neg Hx               Review of Systems   All other systems reviewed and are negative.      Objective:          acetaminophen SR (TYLENOL 8 HOUR) 650 mg tablet Take one tablet by mouth three times daily. NTE 3 gram APAP in 24 hours.    amLODIPine (NORVASC) 5 mg tablet Take one tablet by mouth daily.    cetirizine (ZYRTEC) 10 mg tablet Take one tablet by mouth every morning.    cyanocobalamin (RUBRAMIN) 1,000 mcg/mL injection Inject 1 mL into the muscle every 30 days.    duloxetine DR (CYMBALTA) 30 mg capsule Take one capsule by mouth daily. (Total daily dose = 90mg )    duloxetine DR (CYMBALTA) 60 mg capsule Take one capsule by mouth daily. (Total daily dose = 90mg )    famotidine (PEPCID) 40 mg tablet Take one tablet by mouth at bedtime daily.    guaiFENesin (ROBITUSSIN) 100 mg/5 mL oral solution Take 5 mL by mouth every 6 hours as needed.    levothyroxine (SYNTHROID) 125 mcg tablet Take one tablet by mouth daily 30 minutes before breakfast. Indications: BRAND NAME ONLY    melatonin 5 mg tablet Take one tablet by mouth at bedtime daily.    mirabegron (MYRBETRIQ) 50 mg ER tablet Take one tablet by mouth daily.    mirtazapine (REMERON) 15 mg tablet Take one tablet by mouth at bedtime daily.    mometasone (NASONEX) 50 mcg/actuation nasal spray Apply one spray to two sprays to each nostril as directed twice daily. Indications: chronic nasal congestion    omeprazole DR(+) (PRILOSEC) 40 mg capsule Take one capsule by mouth daily before breakfast.    ondansetron (ZOFRAN) 4 mg tablet Take one tablet by mouth every 12 hours as needed for Nausea or Vomiting.    pimavanserin (NUPLAZID) 34 mg  capsule Take one capsule by mouth daily. (Patient taking differently: Take one capsule by mouth at bedtime daily.)    QUEtiapine (SEROQUEL) 25 mg tablet Taking one in the am and two at bedtime.    rivastigmine tartrate (EXELON) 4.5 mg capsule Take one capsule by mouth twice daily.     There were no vitals filed for this visit.  There is no height or weight on file to calculate BMI.     CBC w diff    Lab Results   Component Value Date/Time    WBC 9.4 08/20/2021 05:21 AM    RBC 4.45 08/20/2021 05:21 AM    HGB 11.7 (L) 08/20/2021 05:21 AM    HCT 34.9 (L) 08/20/2021 05:21 AM    MCV 78.5 (L) 08/20/2021 05:21 AM    MCH 26.3 08/20/2021 05:21 AM    MCHC 33.5 08/20/2021 05:21 AM    RDW 15.9 (H) 08/20/2021 05:21 AM    PLTCT 194 08/20/2021 05:21 AM    MPV 8.6 08/20/2021 05:21 AM    Lab Results   Component Value Date/Time    NEUT 73 08/20/2021 05:21 AM    ANC 6.97 08/20/2021 05:21 AM    LYMA 14 (L) 08/20/2021 05:21 AM    ALC 1.30 08/20/2021 05:21 AM    MONA 9 08/20/2021 05:21 AM    AMC 0.83 (H) 08/20/2021 05:21 AM    EOSA 3 08/20/2021 05:21 AM    AEC 0.24 08/20/2021 05:21 AM    BASA 1 08/20/2021 05:21 AM    ABC 0.07 08/20/2021 05:21 AM        Comprehensive Metabolic Profile    Lab Results   Component Value Date/Time    NA 141 08/20/2021 05:21 AM    K 4.6 08/20/2021 05:21 AM    CL 107 08/20/2021 05:21 AM    CO2 25 08/20/2021 05:21 AM    GAP 9 08/20/2021 05:21 AM    BUN 18 08/20/2021 05:21 AM    CR 1.08 (H) 08/20/2021 05:21 AM    GLU 93 08/20/2021 05:21 AM    Lab Results   Component Value Date/Time    CA 9.5 08/20/2021 05:21 AM    ALBUMIN 4.0 08/19/2021 05:38 AM    TOTPROT 6.9 08/19/2021 05:38 AM    ALKPHOS 68 08/19/2021 05:38 AM    AST 14 08/19/2021 05:38 AM    ALT 8 08/19/2021 05:38 AM    TOTBILI 0.3 08/19/2021 05:38 AM    GFR 56 (L) 01/19/2020 05:33 AM    GFRAA >60 01/19/2020 05:33 AM            Physical Exam  Constitutional:       Appearance: Normal appearance.   HENT:      Mouth/Throat:      Mouth: Mucous membranes are moist.      Pharynx: Oropharynx is clear.   Eyes:      Extraocular Movements: Extraocular movements intact.      Conjunctiva/sclera: Conjunctivae normal.      Pupils: Pupils are equal, round, and reactive to light.   Cardiovascular:      Pulses: Normal pulses.   Pulmonary:      Effort: Pulmonary effort is normal.   Chest:          Comments: Bilateral chest incisions: appear to be healing well. Dermabond glue is intact. There is no presence of redness, swelling, or drainage noted.   Neurological:      Mental Status: She is alert and oriented to person, place,  and time. Mental status is at baseline.      GCS: GCS eye subscore is 4. GCS verbal subscore is 5. GCS motor subscore is 6.      Cranial Nerves: No dysarthria.      Comments: Speech is clear and fluent.    Psychiatric:         Mood and Affect: Mood normal.         Behavior: Behavior normal.         Thought Content: Thought content normal.            Assessment and Plan:       Patient presents today for follow up, status post bilateral IPG replacement with Dr. Hyacinth Meeker on 10/29/22    Incisions are clean, dry, and intact. No presence of erythema, drainage, swelling noted  Call if temperature greater than 100.67F, incision red, drainage or odor noted from incision, pain that is uncontrolled with pain medication or any questions/concerns.  Avoid touching your incision as much as possible. No ointment, creams, or lotion on incisions.   Discussed ongoing would care. Please continue with your current wound regimen    No driving for two weeks post surgery and while on any pain medications.   No straining while trying to have a bowel movement. Keep hydrated and use stool softeners as needed.   Avoid pulling, pushing or lifting greater than 10 pounds.    All questions answered                       Total Time Today was 18 minutes in the following activities: Preparing to see the patient, Obtaining and/or reviewing separately obtained history, Performing a medically appropriate examination and/or evaluation, and Counseling and educating the patient/family/caregiver     Rayne Du, APRN-NP  Phone: [phone]  Pager: [phone]

## 2022-11-13 ENCOUNTER — Encounter: Admit: 2022-11-13 | Discharge: 2022-11-13 | Payer: MEDICARE

## 2022-11-14 ENCOUNTER — Encounter: Admit: 2022-11-14 | Discharge: 2022-11-14 | Payer: MEDICARE

## 2022-11-14 NOTE — Progress Notes
The refill was not processed. The patient requested to no longer receive refill reminder calls. They will be removed from the pharmacy refill management program and will no longer receive refill reminders for any medications. The patient may be re-enrolled in the pharmacy refill management program at any time by contacting the pharmacy. Ambulatory pharmacist notified.    [place]  [phone]

## 2022-11-26 ENCOUNTER — Encounter: Admit: 2022-11-26 | Discharge: 2022-11-26 | Payer: MEDICARE

## 2022-11-27 ENCOUNTER — Encounter: Admit: 2022-11-27 | Discharge: 2022-11-27 | Payer: MEDICARE

## 2022-11-27 MED FILL — NUPLAZID 34 MG PO CAP: 34 mg | ORAL | 30 days supply | Qty: 30 | Fill #6 | Status: AC

## 2022-12-15 ENCOUNTER — Encounter: Admit: 2022-12-15 | Discharge: 2022-12-15 | Payer: MEDICARE

## 2022-12-19 ENCOUNTER — Encounter: Admit: 2022-12-19 | Discharge: 2022-12-19 | Payer: MEDICARE

## 2022-12-19 MED ORDER — NUPLAZID 34 MG PO CAP
34 mg | ORAL_CAPSULE | Freq: Every day | ORAL | 5 refills
Start: 2022-12-19 — End: ?

## 2022-12-20 ENCOUNTER — Encounter: Admit: 2022-12-20 | Discharge: 2022-12-20 | Payer: MEDICARE

## 2022-12-21 ENCOUNTER — Encounter: Admit: 2022-12-21 | Discharge: 2022-12-21 | Payer: MEDICARE

## 2022-12-22 MED FILL — NUPLAZID 34 MG PO CAP: 34 mg | ORAL | 30 days supply | Qty: 30 | Fill #1 | Status: AC

## 2022-12-24 ENCOUNTER — Encounter: Admit: 2022-12-24 | Discharge: 2022-12-24 | Payer: MEDICARE

## 2023-01-08 ENCOUNTER — Encounter: Admit: 2023-01-08 | Discharge: 2023-01-08 | Payer: MEDICARE

## 2023-01-13 ENCOUNTER — Encounter: Admit: 2023-01-13 | Discharge: 2023-01-13 | Payer: MEDICARE

## 2023-01-13 MED FILL — NUPLAZID 34 MG PO CAP: 34 mg | ORAL | 30 days supply | Qty: 30 | Fill #2 | Status: AC

## 2023-01-24 ENCOUNTER — Encounter: Admit: 2023-01-24 | Discharge: 2023-01-24 | Payer: MEDICARE

## 2023-01-24 NOTE — Telephone Encounter
Patty called asking if she should have received any paperwork for their visit next next week?  Called patty back and informed her for the 6 month f/u visit we do not mail the forms. Offered to do so but they might not make it in time.  She said it was fine to do them when they get here.

## 2023-01-29 ENCOUNTER — Encounter: Admit: 2023-01-29 | Discharge: 2023-01-29 | Payer: MEDICARE

## 2023-01-29 ENCOUNTER — Ambulatory Visit: Admit: 2023-01-29 | Discharge: 2023-01-29 | Payer: MEDICARE

## 2023-01-29 DIAGNOSIS — R002 Palpitations: Secondary | ICD-10-CM

## 2023-01-29 DIAGNOSIS — R011 Cardiac murmur, unspecified: Secondary | ICD-10-CM

## 2023-01-29 DIAGNOSIS — S72009A Fracture of unspecified part of neck of unspecified femur, initial encounter for closed fracture: Secondary | ICD-10-CM

## 2023-01-29 DIAGNOSIS — F419 Anxiety disorder, unspecified: Secondary | ICD-10-CM

## 2023-01-29 DIAGNOSIS — C801 Malignant (primary) neoplasm, unspecified: Secondary | ICD-10-CM

## 2023-01-29 DIAGNOSIS — G20A1 Parkinson's disease without dyskinesia or fluctuating manifestations (HCC): Secondary | ICD-10-CM

## 2023-01-29 DIAGNOSIS — M4800 Spinal stenosis, site unspecified: Secondary | ICD-10-CM

## 2023-01-29 DIAGNOSIS — Z974 Presence of external hearing-aid: Secondary | ICD-10-CM

## 2023-01-29 DIAGNOSIS — K219 Gastro-esophageal reflux disease without esophagitis: Secondary | ICD-10-CM

## 2023-01-29 DIAGNOSIS — E049 Nontoxic goiter, unspecified: Secondary | ICD-10-CM

## 2023-01-29 DIAGNOSIS — F3289 Other specified depressive episodes: Secondary | ICD-10-CM

## 2023-01-29 DIAGNOSIS — R2689 Other abnormalities of gait and mobility: Secondary | ICD-10-CM

## 2023-01-29 DIAGNOSIS — L84 Corns and callosities: Secondary | ICD-10-CM

## 2023-01-29 DIAGNOSIS — R109 Unspecified abdominal pain: Secondary | ICD-10-CM

## 2023-01-29 DIAGNOSIS — R443 Hallucinations, unspecified: Secondary | ICD-10-CM

## 2023-01-29 DIAGNOSIS — I10 Essential (primary) hypertension: Secondary | ICD-10-CM

## 2023-01-29 DIAGNOSIS — T50905A Adverse effect of unspecified drugs, medicaments and biological substances, initial encounter: Secondary | ICD-10-CM

## 2023-01-29 NOTE — Assessment & Plan Note
Her symptoms are stable. No medication changes were recommended during the current  visit. IPG settings were not changed.

## 2023-01-29 NOTE — Progress Notes
Alexis Matthews is a 77 y.o. female.    Subjective:             History of Present Illness      Parkinson's Disease Follow-Up Visit    Since my last visit I am: Slightly worse    Symptoms Scale:    Memory problems: Moderate, affects some things  Hallucinations/delusions: Moderate, hallucinations, not bothersome  Depression: Mild, occurs due to a reason  Anxiety: None  Apathy: Marked, forced to do daily activities  Impulsive behavior: None  Nighttime sleep: Mild, wake up to go to bathroom  Daytime sleepiness: Slight, occasionally  Vivid dreams: Mild, frequently not bothersome  REM sleep behavior disorder: Mild, frequently talk but not disturbing  Restless leg syndrome: None  Pain or muscle cramps: None  Urination: Severe, incontinence  Constipation: Moderate, need OTC medication  Dizziness or lightheadedness: Slight, occasional on standing  Tiredness/Fatigue: Slight, reduced stamina  Falling: Mild, no more than once a month  Personal care assistance: Marked, I have a lot of difficulty and I need help with a majority of tasks daily    Total Score:  Total Score: 35    Assistive Devices:  Assistive devices for getting around: Wheelchair pushed by another person    OFF Time:    OFF time: No    Dyskinesia:    Dyskinesia while awake: No    Employment Status:    Employment: Retired - not due to PD          Review of Systems   HENT:  Positive for trouble swallowing.    Gastrointestinal:  Positive for constipation. Negative for abdominal distention, abdominal pain, anal bleeding, blood in stool, diarrhea, nausea, rectal pain and vomiting.   Genitourinary:  Positive for enuresis and urgency. Negative for decreased urine volume, difficulty urinating, dyspareunia, dysuria, flank pain, frequency, genital sores, hematuria, menstrual problem, pelvic pain, vaginal bleeding, vaginal discharge and vaginal pain.   Neurological:  Positive for speech difficulty.         Objective:          acetaminophen SR (TYLENOL 8 HOUR) 650 mg tablet Take one tablet by mouth three times daily. NTE 3 gram APAP in 24 hours.    amLODIPine (NORVASC) 5 mg tablet Take one tablet by mouth daily.    cetirizine (ZYRTEC) 10 mg tablet Take one tablet by mouth every morning.    CHOLEcalciferoL (vitamin D3) 1,000 units tablet Take one tablet by mouth daily.    cyanocobalamin (RUBRAMIN) 1,000 mcg/mL injection Inject 1 mL into the muscle every 30 days.    duloxetine DR (CYMBALTA) 30 mg capsule Take one capsule by mouth daily. (Total daily dose = 90mg )    duloxetine DR (CYMBALTA) 60 mg capsule Take one capsule by mouth daily. (Total daily dose = 90mg )    famotidine (PEPCID) 40 mg tablet Take one tablet by mouth at bedtime daily.    guaiFENesin (ROBITUSSIN) 100 mg/5 mL oral solution Take 5 mL by mouth every 6 hours as needed.    levothyroxine (SYNTHROID) 125 mcg tablet Take one tablet by mouth daily 30 minutes before breakfast. Indications: BRAND NAME ONLY    melatonin 5 mg tablet Take one tablet by mouth at bedtime daily.    mirabegron (MYRBETRIQ) 50 mg ER tablet Take one tablet by mouth daily.    mirtazapine (REMERON) 15 mg tablet Take one tablet by mouth at bedtime daily.    mometasone (NASONEX) 50 mcg/actuation nasal spray Apply one spray to two sprays to  each nostril as directed twice daily. Indications: chronic nasal congestion    omeprazole DR(+) (PRILOSEC) 40 mg capsule Take one capsule by mouth daily before breakfast.    ondansetron (ZOFRAN) 4 mg tablet Take one tablet by mouth every 12 hours as needed for Nausea or Vomiting.    pimavanserin (NUPLAZID) 34 mg capsule Take one capsule by mouth daily.    QUEtiapine (SEROQUEL) 25 mg tablet Taking one in the am and two at bedtime.    rivastigmine tartrate (EXELON) 4.5 mg capsule Take one capsule by mouth twice daily.       Body mass index is 26.63 kg/m?Marland Kitchen     Physical Exam    EXAMINATION:     ORIENTATION: Alert and Oriented.    Cranial Nerves:  reduced facial expression and soft voice.        Right Left   Bradkinesia  Mild Mild   Tremor Hands Resting None None    Postural None None    Kinetic None None           Tremor Other: None  Dyskinesia: None  Muscle Strength: Unremarkable  Gait: Needs assistance to walk and stand         PDQ8 - Quality of Life  Had difficulty getting around in public places?: Always   Had difficulty dressing?: Often   Felt depressed?: Sometimes   Had problems with your close personal relationships?: Occasionally     Had problems with your concentration, for example, when reading or watching TV?: Often   Felt unable to communicate effectively?: Often   Had painful muscle cramps or spasms?: Occasionally   Felt embarrassed in public due to having Parkinson's disease?: Never              PDQ8 Total Score (32 Possible): 17  PDQ8 Total %: 53                  TUG: Cannot do      Assessment and Plan:    Problem   Parkinson's disease with Bilateral STN Implants    Symptoms began in 2001, initial symptoms was right hand tremor. Diagnosed with Parkinson's disease in 2001. Atypical PD Features:  None  Patient has difficulty tolerating Sinemet (carbidopa/levodopa) and dopamine agonists.   Underwent bilateral Subthalamic stimulator implants in 2010 for bothersome tremor with marked improvement.   Nausea and fogginess with Sinemet (carbidopa/levodopa) 25/100   01/13/2018 Side effects to Mirapex (pramipexole)   Rytary (carbidopa/levodopa) extended release capsules causing side effects         PD Dementia    12/21/2020 MOCA Score (out of 30): 17   07/04/2021 On Exelon (rivastigmine)   08/13/2022 MOCA Score (out of 30): 18      Hallucination    04/15/2019 On Nuplazid (pimavanserin): worse when discontinued  06/29/2019 On Seroquel (quetiapine)       Depression    05/06/2014 Geriatric Depression Scale: 13  12/06/2014 On Paxil (paroxetine)   05/27/2015 Wellbutrin (bupropion) caused nausea  Does not like Paxil (paroxetine)   03/03/2019 Geriatric Depression Scale: 12   08/13/2022 Geriatric Depression Scale: 10            Parkinson's disease with Bilateral STN Implants  Her symptoms are stable. No medication changes were recommended during the current  visit. IPG settings were not changed.      PD Dementia  Her symptoms are stable. On Exelon (rivastigmine). No medication changes were recommended during the current  visit.      Depression  Her symptoms are stable. On Cymbalta (duloxetine) and Remeron (mirtazepine). No medication changes were recommended during the current  visit.      Hallucination  Her symptoms are stable. On Seroquel (quetiapine) and Nuplazid (pimavanserin). No medication changes were recommended during the current  visit.      Patient will follow up in approximately 6 months.

## 2023-01-29 NOTE — Assessment & Plan Note
Her symptoms are stable. On Exelon (rivastigmine). No medication changes were recommended during the current  visit.

## 2023-01-29 NOTE — Assessment & Plan Note
Her symptoms are stable. On Seroquel (quetiapine) and Nuplazid (pimavanserin). No medication changes were recommended during the current  visit.

## 2023-01-29 NOTE — Assessment & Plan Note
Her symptoms are stable. On Cymbalta (duloxetine) and Remeron (mirtazepine). No medication changes were recommended during the current  visit.

## 2023-01-31 ENCOUNTER — Encounter: Admit: 2023-01-31 | Discharge: 2023-01-31 | Payer: MEDICARE

## 2023-02-05 ENCOUNTER — Encounter: Admit: 2023-02-05 | Discharge: 2023-02-05 | Payer: MEDICARE

## 2023-02-08 ENCOUNTER — Encounter: Admit: 2023-02-08 | Discharge: 2023-02-08 | Payer: MEDICARE

## 2023-02-08 MED FILL — NUPLAZID 34 MG PO CAP: 34 mg | ORAL | 30 days supply | Qty: 30 | Fill #3 | Status: AC

## 2023-02-20 ENCOUNTER — Encounter: Admit: 2023-02-20 | Discharge: 2023-02-20 | Payer: MEDICARE

## 2023-02-28 ENCOUNTER — Encounter: Admit: 2023-02-28 | Discharge: 2023-02-28 | Payer: MEDICARE

## 2023-03-04 ENCOUNTER — Encounter: Admit: 2023-03-04 | Discharge: 2023-03-04 | Payer: MEDICARE

## 2023-03-05 ENCOUNTER — Encounter: Admit: 2023-03-05 | Discharge: 2023-03-05 | Payer: MEDICARE

## 2023-03-05 MED FILL — NUPLAZID 34 MG PO CAP: 34 mg | ORAL | 30 days supply | Qty: 30 | Fill #4 | Status: AC

## 2023-03-22 ENCOUNTER — Encounter: Admit: 2023-03-22 | Discharge: 2023-03-22 | Payer: MEDICARE

## 2023-03-25 ENCOUNTER — Encounter: Admit: 2023-03-25 | Discharge: 2023-03-25 | Payer: MEDICARE

## 2023-03-25 MED FILL — NUPLAZID 34 MG PO CAP: 34 mg | ORAL | 30 days supply | Qty: 30 | Fill #5 | Status: AC

## 2023-04-08 ENCOUNTER — Encounter: Admit: 2023-04-08 | Discharge: 2023-04-08 | Payer: MEDICARE

## 2023-04-11 ENCOUNTER — Encounter: Admit: 2023-04-11 | Discharge: 2023-04-11 | Payer: MEDICARE

## 2023-04-16 ENCOUNTER — Encounter: Admit: 2023-04-16 | Discharge: 2023-04-16 | Payer: MEDICARE

## 2023-04-17 ENCOUNTER — Encounter: Admit: 2023-04-17 | Discharge: 2023-04-17 | Payer: MEDICARE

## 2023-04-18 ENCOUNTER — Encounter: Admit: 2023-04-18 | Discharge: 2023-04-18 | Payer: MEDICARE

## 2023-04-18 MED FILL — NUPLAZID 34 MG PO CAP: 34 mg | ORAL | 30 days supply | Qty: 30 | Fill #6 | Status: AC

## 2023-05-07 ENCOUNTER — Encounter: Admit: 2023-05-07 | Discharge: 2023-05-07 | Payer: MEDICARE

## 2023-05-08 ENCOUNTER — Encounter: Admit: 2023-05-08 | Discharge: 2023-05-08 | Payer: MEDICARE

## 2023-05-08 MED ORDER — NUPLAZID 34 MG PO CAP
34 mg | ORAL_CAPSULE | Freq: Every day | ORAL | 11 refills | Status: AC
Start: 2023-05-08 — End: ?
  Filled 2023-05-10: qty 30, 30d supply, fill #1

## 2023-05-08 NOTE — Telephone Encounter
Refill request received for Nuplazid. LOV 01/29/23. NOV 08/06/23. Refill RX sent to requesting pharmacy per plan of care and protocol.

## 2023-05-10 ENCOUNTER — Encounter: Admit: 2023-05-10 | Discharge: 2023-05-10 | Payer: MEDICARE

## 2023-05-14 ENCOUNTER — Encounter: Admit: 2023-05-14 | Discharge: 2023-05-14 | Payer: MEDICARE

## 2023-05-29 ENCOUNTER — Encounter: Admit: 2023-05-29 | Discharge: 2023-05-29 | Payer: MEDICARE

## 2023-06-03 ENCOUNTER — Encounter: Admit: 2023-06-03 | Discharge: 2023-06-03 | Payer: MEDICARE

## 2023-06-03 MED FILL — NUPLAZID 34 MG PO CAP: 34 mg | ORAL | 30 days supply | Qty: 30 | Fill #2 | Status: AC

## 2023-06-20 ENCOUNTER — Encounter: Admit: 2023-06-20 | Discharge: 2023-06-20 | Payer: MEDICARE

## 2023-06-26 ENCOUNTER — Encounter: Admit: 2023-06-26 | Discharge: 2023-06-26 | Payer: MEDICARE

## 2023-06-28 ENCOUNTER — Encounter: Admit: 2023-06-28 | Discharge: 2023-06-28 | Payer: MEDICARE

## 2023-06-28 MED FILL — NUPLAZID 34 MG PO CAP: 34 mg | ORAL | 30 days supply | Qty: 30 | Fill #3 | Status: AC

## 2023-07-11 ENCOUNTER — Encounter: Admit: 2023-07-11 | Discharge: 2023-07-11 | Payer: MEDICARE

## 2023-07-17 ENCOUNTER — Encounter: Admit: 2023-07-17 | Discharge: 2023-07-17 | Payer: MEDICARE

## 2023-07-18 ENCOUNTER — Encounter: Admit: 2023-07-18 | Discharge: 2023-07-18 | Payer: MEDICARE

## 2023-07-19 ENCOUNTER — Encounter: Admit: 2023-07-19 | Discharge: 2023-07-19 | Payer: MEDICARE

## 2023-07-21 ENCOUNTER — Encounter: Admit: 2023-07-21 | Discharge: 2023-07-21 | Payer: MEDICARE

## 2023-07-21 MED FILL — NUPLAZID 34 MG PO CAP: 34 mg | ORAL | 30 days supply | Qty: 30 | Fill #4 | Status: AC

## 2023-08-06 ENCOUNTER — Ambulatory Visit: Admit: 2023-08-06 | Discharge: 2023-08-06 | Payer: MEDICARE

## 2023-08-06 ENCOUNTER — Ambulatory Visit: Admit: 2023-08-06 | Discharge: 2023-08-07 | Payer: MEDICARE

## 2023-08-06 ENCOUNTER — Encounter: Admit: 2023-08-06 | Discharge: 2023-08-06 | Payer: MEDICARE

## 2023-08-06 DIAGNOSIS — F3289 Other specified depressive episodes: Secondary | ICD-10-CM

## 2023-08-06 DIAGNOSIS — G20A1 Dementia due to Parkinson's disease with behavioral disturbance (HCC): Secondary | ICD-10-CM

## 2023-08-06 DIAGNOSIS — R443 Hallucinations, unspecified: Secondary | ICD-10-CM

## 2023-08-06 MED ORDER — NUPLAZID 34 MG PO CAP
34 mg | ORAL_CAPSULE | Freq: Every day | ORAL | 11 refills | Status: AC
Start: 2023-08-06 — End: ?
  Filled 2023-08-17: qty 30, 30d supply, fill #1

## 2023-08-06 NOTE — Assessment & Plan Note
Her symptoms are stable. On Cymbalta (duloxetine) and Remeron (mirtazepine). No medication changes were recommended during the current  visit.

## 2023-08-06 NOTE — Progress Notes
Alexis Matthews is a 77 y.o. female.    Subjective:             History of Present Illness      Parkinson's Disease Follow-Up Visit    Since my last visit I am: Slightly worse    Symptoms Scale:    Memory problems: Mild, bothersome  Hallucinations/delusions: Slight, sense of presence  Depression: Slight, not bothersome  Anxiety: None  Apathy: None  Impulsive behavior: None  Nighttime sleep: Mild, wake up to go to bathroom  Daytime sleepiness: Slight, occasionally  Vivid dreams: Mild, frequently not bothersome  REM sleep behavior disorder: Slight, rarely  Restless leg syndrome: None  Pain or muscle cramps: Mild, muscle or joint pain need medications  Urination: Marked, frequent accidents  Constipation: Mild, occasional but can manage  Dizziness or lightheadedness: Slight, occasional on standing  Tiredness/Fatigue: Slight, reduced stamina  Falling: Mild, no more than once a month  Personal care assistance: Marked, I have a lot of difficulty and I need help with a majority of tasks daily    Total Score:  Total Score: 26    Assistive Devices:  Assistive devices for getting around: Wheelchair pushed by another person    OFF Time:    OFF time: No    Dyskinesia:    Dyskinesia while awake: No    Employment Status:    Employment: Retired - not due to PD          Review of Systems   HENT:  Positive for trouble swallowing.    Neurological:  Positive for speech difficulty.         Objective:          acetaminophen SR (TYLENOL 8 HOUR) 650 mg tablet Take one tablet by mouth three times daily. NTE 3 gram APAP in 24 hours.    amLODIPine (NORVASC) 5 mg tablet Take one tablet by mouth daily. Hold if under 110 or pulse in under 60    cetirizine (ZYRTEC) 10 mg tablet Take one tablet by mouth every morning.    duloxetine DR (CYMBALTA) 30 mg capsule Take one capsule by mouth daily. (Total daily dose = 90mg )    duloxetine DR (CYMBALTA) 60 mg capsule Take one capsule by mouth daily. (Total daily dose = 90mg )    guaiFENesin (ROBITUSSIN) 100 mg/5 mL oral solution Take 5 mL by mouth every 6 hours as needed.    levothyroxine (SYNTHROID) 125 mcg tablet Take one tablet by mouth daily 30 minutes before breakfast. Indications: BRAND NAME ONLY    melatonin 5 mg tablet Take one tablet by mouth at bedtime daily.    mirabegron (MYRBETRIQ) 50 mg ER tablet Take one tablet by mouth daily.    mirtazapine (REMERON) 15 mg tablet Take one tablet by mouth at bedtime daily.    mometasone (NASONEX) 50 mcg/actuation nasal spray Apply one spray to two sprays to each nostril as directed twice daily. Indications: chronic nasal congestion    ondansetron (ZOFRAN) 4 mg tablet Take one tablet by mouth every 12 hours as needed for Nausea or Vomiting.    pimavanserin (NUPLAZID) 34 mg capsule Take one capsule by mouth daily.    QUEtiapine (SEROQUEL) 25 mg tablet Taking one in the am and two at bedtime.    rivastigmine tartrate (EXELON) 4.5 mg capsule Take one capsule by mouth twice daily.       Body mass index is 26.79 kg/m?Marland Kitchen     Physical Exam    EXAMINATION:  ORIENTATION: Alert and Oriented.  Visuospatial/Executive Points: 2/5  Naming Points: 3/3  Attention Points: 1/6  Language Points: 1/3  Abstraction Points: 2/2  Delayed Recall Points: 3/5  Orientation Points: 4/6  Education <or= 12 Years: 0/1  MOCA Score (out of 30): 16     Cranial Nerves:  reduced facial expression and soft voice.        Right Left   Bradkinesia  Mild Mild   Tremor Hands Resting None None    Postural None None    Kinetic None None           Tremor Other: None  Dyskinesia: None  Muscle Strength: Unremarkable  Gait: Needs assistance to walk and stand           TUG: Cannot do      Assessment and Plan:    Problem   Parkinson's disease with Bilateral STN Implants    Symptoms began in 2001, initial symptoms was right hand tremor. Diagnosed with Parkinson's disease in 2001. Atypical PD Features:  None  Patient has difficulty tolerating Sinemet (carbidopa/levodopa) and dopamine agonists.   Underwent bilateral Subthalamic stimulator implants in 2010 for bothersome tremor with marked improvement.   Nausea and fogginess with Sinemet (carbidopa/levodopa) 25/100   01/13/2018 Side effects to Mirapex (pramipexole)   Rytary (carbidopa/levodopa) extended release capsules causing side effects  08/06/2023 In assisted living In hospice       PD Dementia    12/21/2020 MOCA Score (out of 30): 17   07/04/2021 On Exelon (rivastigmine)   08/13/2022 MOCA Score (out of 30): 18   08/06/2023 MOCA Score (out of 30): 16      Hallucination    04/15/2019 On Nuplazid (pimavanserin): worse when discontinued  06/29/2019 On Seroquel (quetiapine)       Depression    05/06/2014 Geriatric Depression Scale: 13  12/06/2014 On Paxil (paroxetine)   05/27/2015 Wellbutrin (bupropion) caused nausea  Does not like Paxil (paroxetine)   03/03/2019 Geriatric Depression Scale: 12   08/13/2022 Geriatric Depression Scale: 10              Parkinson's disease with Bilateral STN Implants  Her symptoms are worse. No medication changes were recommended during the current  visit. IPG settings were not changed.      Hallucination  Her symptoms are stable. On Seroquel (quetiapine) and Nuplazid (pimavanserin). No medication changes were recommended during the current  visit.      Depression  Her symptoms are stable. On Cymbalta (duloxetine) and Remeron (mirtazepine). No medication changes were recommended during the current  visit.      PD Dementia  Her symptoms are stable. On Exelon (rivastigmine). No medication changes were recommended during the current  visit.        Patient will follow up in approximately 6 months.

## 2023-08-06 NOTE — Assessment & Plan Note
Her symptoms are stable. On Seroquel (quetiapine) and Nuplazid (pimavanserin). No medication changes were recommended during the current  visit.

## 2023-08-06 NOTE — Assessment & Plan Note
Her symptoms are worse. No medication changes were recommended during the current  visit. IPG settings were not changed.

## 2023-08-06 NOTE — Assessment & Plan Note
Her symptoms are stable. On Exelon (rivastigmine). No medication changes were recommended during the current  visit.

## 2023-08-07 ENCOUNTER — Encounter: Admit: 2023-08-07 | Discharge: 2023-08-07 | Payer: MEDICARE

## 2023-08-12 ENCOUNTER — Encounter: Admit: 2023-08-12 | Discharge: 2023-08-12 | Payer: MEDICARE

## 2023-08-13 ENCOUNTER — Encounter: Admit: 2023-08-13 | Discharge: 2023-08-13 | Payer: MEDICARE

## 2023-08-13 NOTE — Progress Notes
Pharmacy Benefits Investigation    Medication name: pimavanserin (NUPLAZID) 34 mg capsule  Medication status: continuation (refill)        A grant is available through [employer]. A grant was obtained and will provide the patient with $ through 09/23/2024.    Shelva Majestic  Specialty Pharmacy Patient Advocate

## 2023-08-14 ENCOUNTER — Encounter: Admit: 2023-08-14 | Discharge: 2023-08-14 | Payer: MEDICARE

## 2023-08-17 ENCOUNTER — Encounter: Admit: 2023-08-17 | Discharge: 2023-08-17 | Payer: MEDICARE

## 2023-09-04 ENCOUNTER — Encounter: Admit: 2023-09-04 | Discharge: 2023-09-04 | Payer: MEDICARE

## 2023-09-06 ENCOUNTER — Encounter: Admit: 2023-09-06 | Discharge: 2023-09-06 | Payer: MEDICARE

## 2023-09-08 ENCOUNTER — Encounter: Admit: 2023-09-08 | Discharge: 2023-09-08 | Payer: MEDICARE

## 2023-09-09 MED FILL — NUPLAZID 34 MG PO CAP: 34 mg | ORAL | 30 days supply | Qty: 30 | Fill #2 | Status: AC

## 2023-09-12 ENCOUNTER — Encounter: Admit: 2023-09-12 | Discharge: 2023-09-12 | Payer: MEDICARE

## 2023-09-26 ENCOUNTER — Encounter: Admit: 2023-09-26 | Discharge: 2023-09-26 | Payer: MEDICARE

## 2023-09-28 ENCOUNTER — Encounter: Admit: 2023-09-28 | Discharge: 2023-09-28 | Payer: MEDICARE

## 2023-09-29 ENCOUNTER — Encounter: Admit: 2023-09-29 | Discharge: 2023-09-29 | Payer: MEDICARE

## 2023-09-30 ENCOUNTER — Encounter: Admit: 2023-09-30 | Discharge: 2023-09-30 | Payer: MEDICARE

## 2023-09-30 NOTE — Progress Notes
Pharmacy Benefits Investigation    Medication name: pimavanserin (NUPLAZID) 34 mg capsule  Medication status: continuation (refill)    The prior authorization renewal was reapproved for [name] through 09/23/2024.    The out of pocket cost today is $0. This cost may change due to factors including but not limited to changes in insurance coverage.    The copay is affordable. Per patient's request, the medication will be shipped to the patient's address.    Luanne Bras, PHARMD, BCACP

## 2023-10-01 ENCOUNTER — Encounter: Admit: 2023-10-01 | Discharge: 2023-10-01 | Payer: MEDICARE

## 2023-10-01 MED FILL — NUPLAZID 34 MG PO CAP: 34 mg | ORAL | 30 days supply | Qty: 30 | Fill #3 | Status: AC

## 2023-10-10 ENCOUNTER — Encounter: Admit: 2023-10-10 | Discharge: 2023-10-10 | Payer: MEDICARE

## 2023-10-10 NOTE — Progress Notes
Specialty Medication Reassessment Attempt    Medication name: NUPLAZID 34 MG PO CAP    Contacted Alexis Matthews to verify compliance and assess tolerance of their specialty medication.    Sent Alexis Matthews a MyChart message. Patient can reply via MyChart or call the pharmacist at [phone].    Loma Newton, PharmD Candidate [phone]

## 2023-10-11 ENCOUNTER — Encounter: Admit: 2023-10-11 | Discharge: 2023-10-11 | Payer: MEDICARE

## 2023-10-17 ENCOUNTER — Encounter: Admit: 2023-10-17 | Discharge: 2023-10-17 | Payer: MEDICARE

## 2023-10-17 NOTE — Progress Notes
Specialty Medication Reassessment Attempt    Medication name: NUPLAZID 34 MG PO CAP    Contacted [name] to verify compliance and assess tolerance of their specialty medication.    Left voicemail asking patient to return call to the pharmacist at [phone].    Loma Newton, PharmD Candidate [phone]

## 2023-10-18 ENCOUNTER — Encounter: Admit: 2023-10-18 | Discharge: 2023-10-18 | Payer: MEDICARE

## 2023-10-23 ENCOUNTER — Encounter: Admit: 2023-10-23 | Discharge: 2023-10-23 | Payer: MEDICARE

## 2023-10-23 NOTE — Progress Notes
Specialty Medication Reassessment Attempt    Medication name: NUPLAZID 34 MG PO CAP    Contacted [name] to verify compliance and assess tolerance of their specialty medication.    Left voicemail asking patient to return call to the pharmacist at [phone]. This is the third and final attempt to contact the patient. At this time, the patient will no longer be followed by the specialty pharmacy team. The patient may be re-enrolled at any time by contacting the specialty pharmacy at [phone].    Loma Newton, PharmD Candidate [phone]

## 2023-10-26 ENCOUNTER — Encounter: Admit: 2023-10-26 | Discharge: 2023-10-26 | Payer: MEDICARE

## 2023-10-27 ENCOUNTER — Encounter: Admit: 2023-10-27 | Discharge: 2023-10-27 | Payer: MEDICARE

## 2023-10-27 MED FILL — NUPLAZID 34 MG PO CAP: 34 mg | ORAL | 30 days supply | Qty: 30 | Fill #4 | Status: AC

## 2023-11-14 ENCOUNTER — Encounter: Admit: 2023-11-14 | Discharge: 2023-11-14 | Payer: MEDICARE

## 2023-11-15 ENCOUNTER — Encounter: Admit: 2023-11-15 | Discharge: 2023-11-15 | Payer: MEDICARE

## 2023-11-19 ENCOUNTER — Encounter: Admit: 2023-11-19 | Discharge: 2023-11-19 | Payer: MEDICARE

## 2023-11-20 ENCOUNTER — Encounter: Admit: 2023-11-20 | Discharge: 2023-11-20 | Payer: MEDICARE

## 2023-11-20 MED FILL — NUPLAZID 34 MG PO CAP: 34 mg | ORAL | 30 days supply | Qty: 30 | Fill #5 | Status: AC

## 2023-12-09 ENCOUNTER — Encounter: Admit: 2023-12-09 | Discharge: 2023-12-09 | Payer: MEDICARE

## 2023-12-10 ENCOUNTER — Encounter: Admit: 2023-12-10 | Discharge: 2023-12-10 | Payer: MEDICARE

## 2023-12-11 ENCOUNTER — Encounter: Admit: 2023-12-11 | Discharge: 2023-12-11 | Payer: MEDICARE

## 2023-12-12 ENCOUNTER — Encounter: Admit: 2023-12-12 | Discharge: 2023-12-12 | Payer: MEDICARE

## 2023-12-13 MED FILL — NUPLAZID 34 MG PO CAP: 34 mg | ORAL | 30 days supply | Qty: 30 | Fill #6 | Status: AC

## 2023-12-31 ENCOUNTER — Encounter: Admit: 2023-12-31 | Discharge: 2023-12-31

## 2024-01-01 ENCOUNTER — Encounter: Admit: 2024-01-01 | Discharge: 2024-01-01

## 2024-01-05 ENCOUNTER — Encounter: Admit: 2024-01-05 | Discharge: 2024-01-05 | Payer: MEDICARE

## 2024-01-05 MED FILL — NUPLAZID 34 MG PO CAP: 34 mg | ORAL | 30 days supply | Qty: 30 | Fill #7 | Status: AC

## 2024-01-23 ENCOUNTER — Encounter: Admit: 2024-01-23 | Discharge: 2024-01-23 | Payer: MEDICARE

## 2024-01-28 ENCOUNTER — Encounter: Admit: 2024-01-28 | Discharge: 2024-01-28 | Payer: MEDICARE

## 2024-01-28 MED FILL — NUPLAZID 34 MG PO CAP: 34 mg | ORAL | 30 days supply | Qty: 30 | Fill #8 | Status: AC

## 2024-02-06 ENCOUNTER — Encounter: Admit: 2024-02-06 | Discharge: 2024-02-06 | Payer: MEDICARE

## 2024-02-10 ENCOUNTER — Encounter: Admit: 2024-02-10 | Discharge: 2024-02-10 | Payer: MEDICARE

## 2024-02-10 NOTE — Progress Notes
 Parkinson's Disease . Their arrival mode was wheelchair and they were accompanied by: care provider Patty.    Location of Lead Implant: Bilateral Subthalamic Nucleus (STN)   Type of internally implanted generator:Medtronic SC    DEVICE INFORMATION:  RIGHT BRAIN/ LEFT BODY LEFT BRAIN/ RIGHT BODY   Battery Voltage: 2.96  System Continuity Checked: Yes  Identified Problems: None  Battery Status: OK  Referred for Surgical Replacement: No Battery Voltage: 2.95  System Continuity Checked: Yes  Identified Problems: None  Battery Status: OK  Referred for Surgical Replacement: No     RIGHT BRAIN/ LEFT BODY LEFT BRAIN/ RIGHT BODY   PRE-VISIT SETTINGS (ACTIVE GROUP) PRE-VISIT SETTINGS  (ACTIVE GROUP)   Amplitude: 2.9   Pulse Width: 60 micro secs  Rate: 145 pps   Load Impedance: N/A   Contacts: 1-  Case + Amplitude: 2.8   Pulse Width: 60 micro secs  Rate: 145 pps   Load Impedance: N/A   Contacts: 1-  Case +     Final Settings:  RIGHT BRAIN/ LEFT BODY LEFT BRAIN/ RIGHT BODY   POST-VISIT SETTINGS (ACTIVE GROUP) POST-VISIT SETTINGS (ACTIVE GROUP)   Amplitude: 2.9   Pulse Width: 60 micro secs  Rate: 145 pps   Load Impedance: 909 ohms   Contacts: 1-  Case + Amplitude: 2.8   Pulse Width: 60 micro secs  Rate: 145 pps   Load Impedance: 733 ohms   Contacts: 1-  Case +       Electronic analysis of implanted IPG was performed and I evaluated amplitude, pulse width, frequency, impedance and battery life without programming.       Total Time: 5 minutes

## 2024-02-11 ENCOUNTER — Ambulatory Visit: Admit: 2024-02-11 | Discharge: 2024-02-12 | Payer: MEDICARE

## 2024-02-11 ENCOUNTER — Encounter: Admit: 2024-02-11 | Discharge: 2024-02-11 | Payer: MEDICARE

## 2024-02-11 ENCOUNTER — Ambulatory Visit: Admit: 2024-02-11 | Discharge: 2024-02-11 | Payer: MEDICARE

## 2024-02-11 DIAGNOSIS — G20A1 Dementia due to Parkinson's disease with behavioral disturbance (CMS-HCC): Secondary | ICD-10-CM

## 2024-02-11 DIAGNOSIS — R443 Hallucinations, unspecified: Secondary | ICD-10-CM

## 2024-02-11 DIAGNOSIS — F3289 Other specified depressive episodes: Secondary | ICD-10-CM

## 2024-02-11 NOTE — Assessment & Plan Note
 Alexis Matthews's symptoms are stable. On Cymbalta (duloxetine) and Remeron (mirtazepine). No medication changes were recommended during the current  visit.

## 2024-02-11 NOTE — Assessment & Plan Note
 Alexis Matthews's symptoms are stable. On Seroquel (quetiapine) and Nuplazid (pimavanserin). No medication changes were recommended during the current  visit.

## 2024-02-11 NOTE — Progress Notes
 Alexis Matthews is a 78 y.o. female.    Subjective:             History of Present Illness          02/11/2024   Movement Disorder Questionnaire   Since last visit I am: Slightly worse (1-25%)   Memory Problems: Moderate. Definitely affects some of my daily activities.   Hallucinations/delusions: seeing, hearing, feeling or imagining things that are not there or not true: Moderate. I see, hear, or feel things that are not there. I know that these are not real or accurate and are not bothersome.   Depression: feeling sad, blue, hopeless, unable to enjoy things: Slight. Occurs rarely and is not bothersome.   Anxiety: nervous, worried, tearful or tense: None.   Apathy: loss of interest, enthusaism, or concern: None.   Gambling: None.   Shopping: None.   Sex: None.   Pornography: None.   Eating: None.   Doing unnecessary things like emptying drawers/closets in bedroom/garage and leaving things in a mess: None.   Nighttime sleep: No difficulty sleeping through the night.   Daytime sleepiness: Slight. I get sleepy occasionally after a meal or if I am watching television.   Vivid dreams: dreams that are so clear they seem real. Mild. I have them frequently, but they don?t bother me.   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 Mild. I frequently talk in my sleep or act out my dreams, but this has not been disturbing for me or my sleep partner.   Restless legs syndrome: uncomfortable sensations in your legs that are uncontrollable and occur when resting, in the evenings, or when you get in bed. None.   Pain or muscle cramps: Slight. I have pain or cramps occasionally, but they are mild, and I do not take pain medication.   Urination: Moderate. I have occasional accidents because I can?t make it to the bathroom in time.   Constipation: Slight. I am occasionally constipated, but I do not take any medications.   Dizziness or lightheadedness: None.   Tiredness/Fatigue: Slightly. I can finish all my tasks/activities, but my stamina is reduced, and I feel tired more quickly than I used to.   Falling: Slight. I have rarely fallen, maybe only a few times a year.   Personal Care: Marked. I have a lot of difficulty and I need help with a majority of tasks daily.   Assistive devices for getting around: Wheelchair pushed by another person   Are you taking any form of levodopa? No                  Review of Systems   HENT:  Positive for trouble swallowing.    Neurological:  Positive for speech difficulty.         Objective:          acetaminophen SR (TYLENOL 8 HOUR) 650 mg tablet Take one tablet by mouth three times daily. NTE 3 gram APAP in 24 hours.    amLODIPine (NORVASC) 5 mg tablet Take one tablet by mouth daily. Hold if under 110 or pulse in under 60    cetirizine (ZYRTEC) 10 mg tablet Take one tablet by mouth every morning.    duloxetine DR (CYMBALTA) 30 mg capsule Take one capsule by mouth daily. (Total daily dose = 90mg )    duloxetine DR (CYMBALTA) 60 mg capsule Take one capsule by mouth daily. (Total daily dose = 90mg )    guaiFENesin (ROBITUSSIN)  100 mg/5 mL oral solution Take 5 mL by mouth every 6 hours as needed.    levothyroxine (SYNTHROID) 125 mcg tablet Take one tablet by mouth daily 30 minutes before breakfast. Indications: BRAND NAME ONLY    loperamide (IMODIUM A-D) 2 mg capsule Take one capsule by mouth.    melatonin 5 mg tablet Take one tablet by mouth at bedtime daily.    mirabegron (MYRBETRIQ) 50 mg ER tablet Take one tablet by mouth daily.    mirtazapine (REMERON) 15 mg tablet Take one tablet by mouth at bedtime daily.    mometasone (NASONEX) 50 mcg/actuation nasal spray Apply one spray to two sprays to each nostril as directed twice daily. Indications: chronic nasal congestion    ondansetron (ZOFRAN) 4 mg tablet Take one tablet by mouth every 12 hours as needed for Nausea or Vomiting.    pimavanserin (NUPLAZID) 34 mg capsule Take one capsule by mouth daily.    QUEtiapine (SEROQUEL) 25 mg tablet Taking one in the am and two at bedtime.    rivastigmine tartrate (EXELON) 4.5 mg capsule Take one capsule by mouth twice daily.       Body mass index is 25.84 kg/m?Aaron Aas     Physical Exam    EXAMINATION:     ORIENTATION: Alert and Oriented.    Cranial Nerves:  reduced facial expression and soft voice.        Right Left   Bradkinesia  Mild Mild   Tremor Hands Resting None None    Postural None None    Kinetic None None           Tremor Other: None  Dyskinesia: None  Muscle Strength: Unremarkable  Gait: Needs assistance to walk and stand           TUG: Cannot do      Assessment and Plan:    Problem   Parkinson's disease with Bilateral STN Implants    Symptoms began in 2001, initial symptoms was right hand tremor. Diagnosed with Parkinson's disease in 2001. Atypical PD Features:  None  Patient has difficulty tolerating Sinemet (carbidopa/levodopa) and dopamine agonists.   Underwent bilateral Subthalamic stimulator implants in 2010 for bothersome tremor with marked improvement.   Nausea and fogginess with Sinemet (carbidopa/levodopa) 25/100   01/13/2018 Side effects to Mirapex (pramipexole)   Rytary (carbidopa/levodopa) extended release capsules causing side effects  08/06/2023 In assisted living        PD Dementia    12/21/2020 MOCA Score (out of 30): 17   07/04/2021 On Exelon (rivastigmine)   08/13/2022 MOCA Score (out of 30): 18   08/06/2023 MOCA Score (out of 30): 16      Hallucination    04/15/2019 On Nuplazid (pimavanserin): worse when discontinued  06/29/2019 On Seroquel (quetiapine)       Depression    05/06/2014 Geriatric Depression Scale: 13  12/06/2014 On Paxil (paroxetine)   05/27/2015 Wellbutrin (bupropion) caused nausea  Does not like Paxil (paroxetine)   08/13/2022 Geriatric Depression Scale: 10   02/11/2024 Geriatric Depression Scale: (Patient-Rptd) 11                Parkinson's disease with Bilateral STN Implants  Raela's symptoms are worse. No medication changes were recommended during the current  visit. IPG settings were not changed.      PD Dementia  Jaya's symptoms are stable. On Exelon (rivastigmine). No medication changes were recommended during the current  visit.      Hallucination  Albert's symptoms are stable. On  Seroquel (quetiapine) and Nuplazid (pimavanserin). No medication changes were recommended during the current  visit.      Depression  Merikay's symptoms are stable. On Cymbalta (duloxetine) and Remeron (mirtazepine). No medication changes were recommended during the current  visit.          Patient will follow up in approximately 6 months.

## 2024-02-11 NOTE — Assessment & Plan Note
 Junell's symptoms are worse. No medication changes were recommended during the current  visit. IPG settings were not changed.

## 2024-02-11 NOTE — Assessment & Plan Note
 Alexis Matthews's symptoms are stable. On Exelon (rivastigmine). No medication changes were recommended during the current  visit.

## 2024-02-20 ENCOUNTER — Encounter: Admit: 2024-02-20 | Discharge: 2024-02-20 | Payer: MEDICARE

## 2024-02-22 ENCOUNTER — Encounter: Admit: 2024-02-22 | Discharge: 2024-02-22 | Payer: MEDICARE

## 2024-02-22 MED FILL — NUPLAZID 34 MG PO CAP: 34 mg | ORAL | 30 days supply | Qty: 30 | Fill #9 | Status: AC

## 2024-03-12 ENCOUNTER — Encounter: Admit: 2024-03-12 | Discharge: 2024-03-12 | Payer: MEDICARE

## 2024-03-13 ENCOUNTER — Encounter: Admit: 2024-03-13 | Discharge: 2024-03-13 | Payer: MEDICARE

## 2024-03-14 ENCOUNTER — Encounter: Admit: 2024-03-14 | Discharge: 2024-03-14 | Payer: MEDICARE

## 2024-03-15 ENCOUNTER — Encounter: Admit: 2024-03-15 | Discharge: 2024-03-15 | Payer: MEDICARE

## 2024-03-15 MED FILL — NUPLAZID 34 MG PO CAP: 34 mg | ORAL | 30 days supply | Qty: 30 | Fill #9 | Status: AC

## 2024-04-02 ENCOUNTER — Encounter: Admit: 2024-04-02 | Discharge: 2024-04-02 | Payer: MEDICARE

## 2024-04-03 ENCOUNTER — Encounter: Admit: 2024-04-03 | Discharge: 2024-04-03 | Payer: MEDICARE

## 2024-04-06 ENCOUNTER — Encounter: Admit: 2024-04-06 | Discharge: 2024-04-06 | Payer: MEDICARE

## 2024-04-11 ENCOUNTER — Encounter: Admit: 2024-04-11 | Discharge: 2024-04-11 | Payer: MEDICARE

## 2024-04-12 MED FILL — NUPLAZID 34 MG PO CAP: 34 mg | ORAL | 30 days supply | Qty: 30 | Fill #10 | Status: AC

## 2024-04-30 ENCOUNTER — Encounter: Admit: 2024-04-30 | Discharge: 2024-04-30 | Payer: MEDICARE

## 2024-05-02 ENCOUNTER — Encounter: Admit: 2024-05-02 | Discharge: 2024-05-02 | Payer: MEDICARE

## 2024-05-03 MED FILL — NUPLAZID 34 MG PO CAP: 34 mg | ORAL | 30 days supply | Qty: 30 | Fill #11 | Status: AC

## 2024-05-18 ENCOUNTER — Encounter: Admit: 2024-05-18 | Discharge: 2024-05-18 | Payer: MEDICARE

## 2024-05-18 DIAGNOSIS — R443 Hallucinations, unspecified: Principal | ICD-10-CM

## 2024-05-18 MED ORDER — NUPLAZID 34 MG PO CAP
34 mg | ORAL_CAPSULE | Freq: Every day | ORAL | 3 refills | 30.00000 days | Status: AC
Start: 2024-05-18 — End: ?
  Filled 2024-05-27: qty 30, 30d supply, fill #0

## 2024-05-18 NOTE — Telephone Encounter
 Received a refill request for Nuplazid .   LOV 02/11/24  NOV 08/11/24  Rx refilled.

## 2024-05-19 ENCOUNTER — Encounter: Admit: 2024-05-19 | Discharge: 2024-05-19 | Payer: MEDICARE

## 2024-05-22 ENCOUNTER — Encounter: Admit: 2024-05-22 | Discharge: 2024-05-22 | Payer: MEDICARE

## 2024-05-26 ENCOUNTER — Encounter: Admit: 2024-05-26 | Discharge: 2024-05-26 | Payer: MEDICARE

## 2024-06-09 ENCOUNTER — Encounter: Admit: 2024-06-09 | Discharge: 2024-06-09 | Payer: MEDICARE

## 2024-06-11 ENCOUNTER — Encounter: Admit: 2024-06-11 | Discharge: 2024-06-11 | Payer: MEDICARE

## 2024-06-12 ENCOUNTER — Encounter: Admit: 2024-06-12 | Discharge: 2024-06-12 | Payer: MEDICARE

## 2024-06-15 ENCOUNTER — Encounter: Admit: 2024-06-15 | Discharge: 2024-06-15 | Payer: MEDICARE

## 2024-06-18 ENCOUNTER — Encounter: Admit: 2024-06-18 | Discharge: 2024-06-18 | Payer: MEDICARE

## 2024-06-19 ENCOUNTER — Encounter: Admit: 2024-06-19 | Discharge: 2024-06-19 | Payer: MEDICARE

## 2024-06-19 MED FILL — NUPLAZID 34 MG PO CAP: 34 mg | ORAL | 30 days supply | Qty: 30 | Fill #1 | Status: AC

## 2024-07-08 ENCOUNTER — Encounter: Admit: 2024-07-08 | Discharge: 2024-07-08 | Payer: MEDICARE

## 2024-07-12 ENCOUNTER — Encounter: Admit: 2024-07-12 | Discharge: 2024-07-12 | Payer: MEDICARE

## 2024-07-12 MED FILL — NUPLAZID 34 MG PO CAP: 34 mg | ORAL | 30 days supply | Qty: 30 | Fill #2 | Status: AC

## 2024-07-20 ENCOUNTER — Encounter: Admit: 2024-07-20 | Discharge: 2024-07-20 | Payer: MEDICARE

## 2024-07-29 ENCOUNTER — Encounter: Admit: 2024-07-29 | Discharge: 2024-07-29 | Payer: MEDICARE

## 2024-07-30 ENCOUNTER — Encounter: Admit: 2024-07-30 | Discharge: 2024-07-30 | Payer: MEDICARE

## 2024-07-31 ENCOUNTER — Encounter: Admit: 2024-07-31 | Discharge: 2024-07-31 | Payer: MEDICARE

## 2024-08-04 ENCOUNTER — Encounter: Admit: 2024-08-04 | Discharge: 2024-08-04 | Payer: MEDICARE

## 2024-08-05 ENCOUNTER — Encounter: Admit: 2024-08-05 | Discharge: 2024-08-05 | Payer: MEDICARE

## 2024-08-05 MED FILL — NUPLAZID 34 MG PO CAP: 34 mg | ORAL | 30 days supply | Qty: 30 | Fill #3 | Status: AC

## 2024-08-07 ENCOUNTER — Encounter: Admit: 2024-08-07 | Discharge: 2024-08-07 | Payer: MEDICARE

## 2024-08-11 ENCOUNTER — Encounter: Admit: 2024-08-11 | Discharge: 2024-08-11 | Payer: MEDICARE

## 2024-08-11 ENCOUNTER — Ambulatory Visit: Admit: 2024-08-11 | Discharge: 2024-08-12 | Payer: MEDICARE

## 2024-08-11 ENCOUNTER — Ambulatory Visit: Admit: 2024-08-11 | Discharge: 2024-08-11 | Payer: MEDICARE

## 2024-08-11 VITALS — BP 129/75 | HR 80 | Ht 65.0 in | Wt 159.0 lb

## 2024-08-11 DIAGNOSIS — G20A1 Parkinson's disease without dyskinesia or fluctuating manifestations (CMS-HCC): Principal | ICD-10-CM

## 2024-08-11 DIAGNOSIS — R443 Hallucinations, unspecified: Secondary | ICD-10-CM

## 2024-08-11 DIAGNOSIS — F3289 Other specified depressive episodes: Secondary | ICD-10-CM

## 2024-08-11 MED ORDER — QUETIAPINE 25 MG PO TAB
ORAL_TABLET | ORAL | 3 refills | 30.00000 days | Status: AC
Start: 2024-08-11 — End: ?

## 2024-08-11 NOTE — Assessment & Plan Note [38]
 Romanda's symptoms are stable. On Cymbalta (duloxetine) and Remeron (mirtazepine). No medication changes were recommended during the current  visit.

## 2024-08-11 NOTE — Assessment & Plan Note [38]
 Alexis Matthews's symptoms are stable. On Seroquel (quetiapine) and Nuplazid (pimavanserin). No medication changes were recommended during the current  visit.

## 2024-08-11 NOTE — Progress Notes [1]
 Alexis Matthews is a 78 y.o. female.    Subjective:             History of Present Illness          08/11/2024   Movement Disorder Questionnaire   Since last visit I am: Unchanged    Memory Problems: Moderate. Definitely affects some of my daily activities.    Hallucinations/delusions: seeing, hearing, feeling or imagining things that are not there or not true: Mild. I mistake objects for other things like imagining a rope as a snake. It is not bothersome.    Depression: feeling sad, blue, hopeless, unable to enjoy things: Slight. Occurs rarely and is not bothersome.    Anxiety: nervous, worried, tearful or tense: None.    Apathy: loss of interest, enthusaism, or concern: None.    Gambling: Slight. I have urges but I can control them.    Shopping: Moderate. Impulsive behaviors occur occasionally and have caused problems for me and my family.    Sex: None.    Pornography: None.    Eating: Moderate. Impulsive behaviors occur occasionally and have caused problems for me and my family.    Doing unnecessary things like emptying drawers/closets in bedroom/garage and leaving things in a mess: None.    Nighttime sleep: Mild. I fall asleep without much difficulty and may wake up during the night to go to the bathroom but have no difficulty going back to sleep.    Daytime sleepiness: Slight. I get sleepy occasionally after a meal or if I am watching television.    Vivid dreams: dreams that are so clear they seem real. Severe. I have nightmares and may see images upon awakening and they are disturbing for me.    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 Slightly, rarely do I talk or yell in my sleep.    Restless legs syndrome: uncomfortable sensations in your legs that are uncontrollable and occur when resting, in the evenings, or when you get in bed. None.    Pain or muscle cramps: Mild. I have muscle or joint pain and occasionally take pain medications.    Urination: Severe. I have urinary incontinence.    Constipation: Slight. I am occasionally constipated, but I do not take any medications.    Dizziness or lightheadedness: Slight. I have occasional dizziness if I stand up too quickly, but it resolves quickly and has not caused problems for me.    Tiredness/Fatigue: Mild. I am occasionally tired and sometimes I am not able to complete my tasks/activities.    Falling: Moderate. I fall occasionally.    Typically, how many times per month? 2    Personal Care: Marked. I have a lot of difficulty and I need help with a majority of tasks daily.    Assistive devices for getting around: Wheelchair pushed by another person    Are you taking any form of levodopa ? No        Patient-reported                  Review of Systems   HENT:  Positive for trouble swallowing.    Neurological:  Positive for speech difficulty.         Objective:          acetaminophen  SR (TYLENOL  8 HOUR) 650 mg tablet Take one tablet by mouth three times daily. NTE 3 gram APAP in 24 hours.    amLODIPine  (NORVASC ) 5 mg tablet Take one tablet  by mouth daily. Hold if under 110 or pulse in under 60    cetirizine (ZYRTEC) 10 mg tablet Take one tablet by mouth every morning. (Patient not taking: Reported on 08/11/2024)    duloxetine  DR (CYMBALTA ) 30 mg capsule Take one capsule by mouth daily. (Total daily dose = 90mg )    duloxetine  DR (CYMBALTA ) 60 mg capsule Take one capsule by mouth daily. (Total daily dose = 90mg )    guaiFENesin  (ROBITUSSIN) 100 mg/5 mL oral solution Take 5 mL by mouth every 6 hours as needed. (Patient not taking: Reported on 08/11/2024)    levothyroxine  (SYNTHROID) 125 mcg tablet Take one tablet by mouth daily 30 minutes before breakfast. Indications: BRAND NAME ONLY    loperamide (IMODIUM A-D) 2 mg capsule Take one capsule by mouth.    loratadine (CLARITIN) 10 mg tablet Take one tablet by mouth every morning.    melatonin 5 mg tablet Take one tablet by mouth at bedtime daily.    mirabegron  (MYRBETRIQ ) 50 mg ER tablet Take one tablet by mouth daily.    mirtazapine  (REMERON ) 15 mg tablet Take one tablet by mouth at bedtime daily.    mometasone (NASONEX) 50 mcg/actuation nasal spray Apply one spray to two sprays to each nostril as directed twice daily. Indications: chronic nasal congestion    nystatin (MYCOSTATIN) 100,000 unit/g topical cream Apply  topically to affected area every 12 hours as needed.    ondansetron  (ZOFRAN ) 4 mg tablet Take one tablet by mouth every 12 hours as needed for Nausea or Vomiting. (Patient not taking: Reported on 08/11/2024)    pimavanserin (NUPLAZID ) 34 mg capsule Take one capsule by mouth daily.    QUEtiapine  (SEROQUEL ) 25 mg tablet Taking 2  in the am and 3  at bedtime.    rivastigmine  tartrate (EXELON ) 4.5 mg capsule Take one capsule by mouth twice daily.       There is no height or weight on file to calculate BMI.     Physical Exam    EXAMINATION:     ORIENTATION: Alert and Oriented.    Cranial Nerves:  reduced facial expression and soft voice.        Right Left   Bradkinesia  Mild Mild   Tremor Hands Resting None None    Postural None None    Kinetic None None           Tremor Other: None  Dyskinesia: None  Muscle Strength: Unremarkable  Gait: Needs assistance to walk and stand           TUG: Cannot do      Assessment and Plan:    Problem   Parkinson's disease with Bilateral STN Implants    Symptoms began in 2001, initial symptoms was right hand tremor. Diagnosed with Parkinson's disease in 2001. Atypical PD Features:  None  Patient has difficulty tolerating Sinemet  (carbidopa /levodopa ) and dopamine agonists.   Underwent bilateral Subthalamic stimulator implants in 2010 for bothersome tremor with marked improvement.   Nausea and fogginess with Sinemet  (carbidopa /levodopa ) 25/100   01/13/2018 Side effects to Mirapex  (pramipexole )   Rytary  (carbidopa /levodopa ) extended release capsules causing side effects  08/06/2023 In assisted living        PD Dementia    12/21/2020 MOCA Score (out of 30): 17 07/04/2021 On Exelon  (rivastigmine )   08/13/2022 MOCA Score (out of 30): 18   08/06/2023 MOCA Score (out of 30): 16      Hallucination    04/15/2019 On Nuplazid  (pimavanserin): worse when discontinued  06/29/2019 On Seroquel  (quetiapine )       Depression    05/06/2014 Geriatric Depression Scale: 13  12/06/2014 On Paxil  (paroxetine )   05/27/2015 Wellbutrin (bupropion) caused nausea  Does not like Paxil  (paroxetine )   08/13/2022 Geriatric Depression Scale: 10   02/11/2024 Geriatric Depression Scale: (Patient-Rptd) 11                  Parkinson's disease with Bilateral STN Implants  Alexis Matthews's symptoms are worse. On no meds and No medication changes were recommended during the current  visit. IPG settings were changed.      PD Dementia  Alexis Matthews's symptoms are stable. On Exelon  (rivastigmine ). No medication changes were recommended during the current  visit.      Hallucination  Alexis Matthews's symptoms are stable. On Seroquel  (quetiapine ) and Nuplazid  (pimavanserin). No medication changes were recommended during the current  visit.      Depression  Alexis Matthews's symptoms are stable. On Cymbalta  (duloxetine ) and Remeron  (mirtazepine). No medication changes were recommended during the current  visit.            Patient will follow up in approximately 6 months.

## 2024-08-11 NOTE — Assessment & Plan Note [38]
 Alexis Matthews's symptoms are stable. On Exelon (rivastigmine). No medication changes were recommended during the current  visit.

## 2024-08-24 ENCOUNTER — Encounter: Admit: 2024-08-24 | Discharge: 2024-08-24 | Payer: MEDICARE

## 2024-08-24 NOTE — Telephone Encounter [36]
 Caregiver at  [facility] calls to inquire what needs to be done post patient visit. This calls and instructs them that DBS changes were made and to call [phone] if these changes are not received well or if anything else is needed.

## 2024-08-24 NOTE — Telephone Encounter [36]
 Kim an LPN from [employer] called stating that the loved one of this pt said they were supposed to follow up on care of this pt but were unsure on what that meant and asked for a return call.     RN called and talked with Luke LPN. RN let her know her know that changes had been made to the pt's DBS at the last visit due to increasing rigidity to her lower extremities. We ask that they keep an eye this and let us  know if she is getting any better or worse so we can adjust accordingly. Kim verbalizes understanding.

## 2024-08-26 ENCOUNTER — Encounter: Admit: 2024-08-26 | Discharge: 2024-08-26 | Payer: MEDICARE

## 2024-08-27 ENCOUNTER — Encounter: Admit: 2024-08-27 | Discharge: 2024-08-27 | Payer: MEDICARE

## 2024-08-29 ENCOUNTER — Encounter: Admit: 2024-08-29 | Discharge: 2024-08-29 | Payer: MEDICARE

## 2024-08-29 MED FILL — NUPLAZID 34 MG PO CAP: 34 mg | ORAL | 30 days supply | Qty: 30 | Fill #4 | Status: AC

## 2024-09-15 ENCOUNTER — Encounter: Admit: 2024-09-15 | Discharge: 2024-09-15 | Payer: MEDICARE

## 2024-09-16 ENCOUNTER — Encounter: Admit: 2024-09-16 | Discharge: 2024-09-16 | Payer: MEDICARE

## 2024-09-20 ENCOUNTER — Encounter: Admit: 2024-09-20 | Discharge: 2024-09-20 | Payer: MEDICARE

## 2024-09-21 ENCOUNTER — Encounter: Admit: 2024-09-21 | Discharge: 2024-09-21 | Payer: MEDICARE

## 2024-09-21 MED FILL — NUPLAZID 34 MG PO CAP: 34 mg | ORAL | 30 days supply | Qty: 30 | Fill #5 | Status: AC

## 2024-09-21 NOTE — Progress Notes [1]
 Specialty Medication Patient Management Program Participation      Medication name: NUPLAZID  34 MG PO CAP    Alexis Matthews will be receiving their specialty medication in a long-term care facility and is under the supervision of a healthcare provider at this facility. They will receive education and clinical monitoring support through the facility. Thus, the patient will not require additional outreach from the pharmacist and will not be included in the specialty pharmacy patient management program. The patient may contact the pharmacist if they have questions, would like additional support, or if their status should change in which they are no longer under direct supervision of a healthcare provider. The clinic has been notified of the information above.      Alexis Matthews, PHARMD, BCACP

## 2024-10-02 ENCOUNTER — Encounter: Admit: 2024-10-02 | Discharge: 2024-10-02 | Payer: MEDICARE

## 2024-10-09 ENCOUNTER — Encounter: Admit: 2024-10-09 | Discharge: 2024-10-09 | Payer: MEDICARE

## 2024-10-09 NOTE — Progress Notes [1]
 Pharmacy Benefits Investigation    Medication name: pimavanserin (NUPLAZID ) 34 mg capsule  Medication status: continuation (refill)  Medication regimen: Take 1 capsule by mouth daily    The prior authorization renewal was reapproved for Alexis Matthews through 09/23/2025.      Mariel Finder  Specialty Pharmacy Patient Advocate

## 2024-10-12 ENCOUNTER — Encounter: Admit: 2024-10-12 | Discharge: 2024-10-12 | Payer: MEDICARE

## 2024-10-13 ENCOUNTER — Encounter: Admit: 2024-10-13 | Discharge: 2024-10-13 | Payer: MEDICARE

## 2024-10-19 ENCOUNTER — Encounter: Admit: 2024-10-19 | Discharge: 2024-10-19 | Payer: MEDICARE

## 2024-10-23 ENCOUNTER — Encounter: Admit: 2024-10-23 | Discharge: 2024-10-23 | Payer: MEDICARE

## 2024-10-23 NOTE — Progress Notes [1]
 Pharmacy Benefits Investigation    Medication name: pimavanserin (NUPLAZID ) 34 mg capsule  Medication status: continuation (refill)        A grant is available through [employer]26. A grant was obtained and will provide the patient with $ through 09/23/2025.    Izetta Balder, PHARMD, BCACP

## 2024-10-24 MED FILL — NUPLAZID 34 MG PO CAP: 34 mg | ORAL | 30 days supply | Qty: 30 | Fill #6 | Status: AC

## 2024-10-28 ENCOUNTER — Encounter: Admit: 2024-10-28 | Discharge: 2024-10-28 | Payer: MEDICARE

## 2024-10-28 NOTE — Telephone Encounter [36]
 Nursing facility [facility]) calls to report that cognitive decline in patient.   This nurse calls back to obtain more details. Reports that Leontine, NP had wanted us  called.  Reports unaware if due to low blood pressure as has been running low. Does have any more information regarding cognition changes such as if short term memory loss, hallunications or due to acute cause such as UTI.  Reports will gather more information including several sets of orthostatic BP and call us  back.
